# Patient Record
Sex: Female | Born: 1980 | Race: Black or African American | Hispanic: No | Marital: Single | Smoking: Never smoker
Health system: Southern US, Community
[De-identification: ages and names within clinical notes are randomized; demographics above are authoritative.]

## PROBLEM LIST (undated history)

## (undated) DIAGNOSIS — F329 Major depressive disorder, single episode, unspecified: Secondary | ICD-10-CM

## (undated) DIAGNOSIS — R519 Headache, unspecified: Secondary | ICD-10-CM

## (undated) DIAGNOSIS — F419 Anxiety disorder, unspecified: Secondary | ICD-10-CM

## (undated) DIAGNOSIS — R51 Headache: Secondary | ICD-10-CM

## (undated) DIAGNOSIS — G43909 Migraine, unspecified, not intractable, without status migrainosus: Secondary | ICD-10-CM

## (undated) DIAGNOSIS — E7800 Pure hypercholesterolemia, unspecified: Secondary | ICD-10-CM

## (undated) DIAGNOSIS — F32A Depression, unspecified: Secondary | ICD-10-CM

## (undated) HISTORY — DX: Migraine, unspecified, not intractable, without status migrainosus: G43.909

## (undated) HISTORY — DX: Depression, unspecified: F32.A

## (undated) HISTORY — DX: Major depressive disorder, single episode, unspecified: F32.9

---
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
## 2002-05-22 ENCOUNTER — Emergency Department (HOSPITAL_COMMUNITY): Admission: EM | Admit: 2002-05-22 | Discharge: 2002-05-22 | Payer: Self-pay | Admitting: Emergency Medicine

## 2002-05-24 ENCOUNTER — Emergency Department (HOSPITAL_COMMUNITY): Admission: EM | Admit: 2002-05-24 | Discharge: 2002-05-24 | Payer: Self-pay | Admitting: Emergency Medicine

## 2002-06-30 ENCOUNTER — Inpatient Hospital Stay (HOSPITAL_COMMUNITY): Admission: AD | Admit: 2002-06-30 | Discharge: 2002-06-30 | Payer: Self-pay | Admitting: Obstetrics and Gynecology

## 2004-01-07 ENCOUNTER — Emergency Department (HOSPITAL_COMMUNITY): Admission: EM | Admit: 2004-01-07 | Discharge: 2004-01-07 | Payer: Self-pay | Admitting: Family Medicine

## 2004-04-05 ENCOUNTER — Other Ambulatory Visit: Admission: RE | Admit: 2004-04-05 | Discharge: 2004-04-05 | Payer: Self-pay | Admitting: Obstetrics and Gynecology

## 2004-04-14 ENCOUNTER — Emergency Department (HOSPITAL_COMMUNITY): Admission: EM | Admit: 2004-04-14 | Discharge: 2004-04-15 | Payer: Self-pay | Admitting: Emergency Medicine

## 2006-03-20 ENCOUNTER — Ambulatory Visit: Payer: Self-pay | Admitting: Psychiatry

## 2006-03-20 ENCOUNTER — Emergency Department (HOSPITAL_COMMUNITY): Admission: EM | Admit: 2006-03-20 | Discharge: 2006-03-20 | Payer: Self-pay | Admitting: Emergency Medicine

## 2006-03-20 ENCOUNTER — Inpatient Hospital Stay (HOSPITAL_COMMUNITY): Admission: AD | Admit: 2006-03-20 | Discharge: 2006-03-27 | Payer: Self-pay | Admitting: Psychiatry

## 2007-05-21 ENCOUNTER — Other Ambulatory Visit: Admission: RE | Admit: 2007-05-21 | Discharge: 2007-05-21 | Payer: Self-pay | Admitting: Obstetrics and Gynecology

## 2007-09-02 ENCOUNTER — Emergency Department (HOSPITAL_COMMUNITY): Admission: EM | Admit: 2007-09-02 | Discharge: 2007-09-03 | Payer: Self-pay | Admitting: Emergency Medicine

## 2007-09-03 ENCOUNTER — Inpatient Hospital Stay (HOSPITAL_COMMUNITY): Admission: AD | Admit: 2007-09-03 | Discharge: 2007-09-09 | Payer: Self-pay | Admitting: Psychiatry

## 2007-09-03 ENCOUNTER — Ambulatory Visit: Payer: Self-pay | Admitting: Psychiatry

## 2008-01-26 ENCOUNTER — Emergency Department (HOSPITAL_COMMUNITY): Admission: EM | Admit: 2008-01-26 | Discharge: 2008-01-26 | Payer: Self-pay | Admitting: Emergency Medicine

## 2008-02-20 ENCOUNTER — Emergency Department (HOSPITAL_COMMUNITY): Admission: EM | Admit: 2008-02-20 | Discharge: 2008-02-20 | Payer: Self-pay | Admitting: Emergency Medicine

## 2008-02-23 ENCOUNTER — Inpatient Hospital Stay (HOSPITAL_COMMUNITY): Admission: EM | Admit: 2008-02-23 | Discharge: 2008-02-24 | Payer: Self-pay | Admitting: Emergency Medicine

## 2008-03-01 ENCOUNTER — Ambulatory Visit: Payer: Self-pay | Admitting: Family Medicine

## 2008-03-01 ENCOUNTER — Encounter: Payer: Self-pay | Admitting: *Deleted

## 2008-03-01 DIAGNOSIS — N12 Tubulo-interstitial nephritis, not specified as acute or chronic: Secondary | ICD-10-CM | POA: Insufficient documentation

## 2008-03-01 DIAGNOSIS — D649 Anemia, unspecified: Secondary | ICD-10-CM

## 2008-03-01 DIAGNOSIS — F3289 Other specified depressive episodes: Secondary | ICD-10-CM | POA: Insufficient documentation

## 2008-03-01 DIAGNOSIS — F329 Major depressive disorder, single episode, unspecified: Secondary | ICD-10-CM

## 2009-03-16 ENCOUNTER — Emergency Department (HOSPITAL_COMMUNITY)
Admission: EM | Admit: 2009-03-16 | Discharge: 2009-03-16 | Payer: Self-pay | Source: Home / Self Care | Admitting: Emergency Medicine

## 2009-11-30 ENCOUNTER — Ambulatory Visit: Payer: Self-pay | Admitting: Family Medicine

## 2009-12-28 ENCOUNTER — Observation Stay (HOSPITAL_COMMUNITY)
Admission: EM | Admit: 2009-12-28 | Discharge: 2009-12-28 | Payer: Self-pay | Source: Home / Self Care | Admitting: Emergency Medicine

## 2009-12-31 ENCOUNTER — Emergency Department (HOSPITAL_COMMUNITY)
Admission: EM | Admit: 2009-12-31 | Discharge: 2010-01-01 | Payer: Self-pay | Source: Home / Self Care | Admitting: Emergency Medicine

## 2010-02-13 NOTE — Assessment & Plan Note (Signed)
Summary: uri?,df   Vital Signs:  Patient profile:   30 year old female Weight:      155 pounds Temp:     98.5 degrees F oral Pulse rate:   87 / minute BP sitting:   117 / 73  (right arm) Cuff size:   regular  Vitals Entered By: Tessie Fass CMA (November 30, 2009 11:34 AM) CC: cough and congestion Is Patient Diabetic? No Pain Assessment Patient in pain? no        Primary Care Provider:  Helane Rima DO  CC:  cough and congestion.  History of Present Illness: 30 y/o F with cough and congestion x 2 wks   Sore throat x 2 wks Cough with green sputum x 1 1/2 wks.  Cough worse in AM No fever since last week, some chills last week +Rhinorrhea +Pain in ears intermitten +body aches x 2 days last week Sometimes feels faint, tired, drained Drinking lots of echinacea tea No vomiiting, no diarrhea Some watery eyes No wheezing No dypsnea Took Mucinex Took otc antihistamine Took multi-cold symptom otc med No weight loss, hemoptysis, night sweats  Current Medications (verified): 1)  Zoloft 50 Mg Tabs (Sertraline Hcl) .Marland Kitchen.. 1 1/2 Tabs By Mouth Daily Per Dr Dicky Doe At [facility]  Allergies (verified): No Known Drug Allergies  Review of Systems       per hpi   Physical Exam  General:  Well-developed,well-nourished,in no acute distress; alert,appropriate and cooperative throughout examination. vitals reviewed.  Ears:  External ear exam shows no significant lesions or deformities.  Otoscopic examination reveals clear canals, tympanic membranes are intact bilaterally without bulging, retraction, inflammation or discharge. Hearing is grossly normal bilaterally. Nose:  mucosal erythema.   Mouth:  Oral mucosa and oropharynx without lesions or exudates.  Teeth in good repair. Lungs:  Normal respiratory effort, chest expands symmetrically. Lungs are clear to auscultation, no crackles or wheezes. Heart:  Normal rate and regular rhythm. S1 and S2 normal without gallop, murmur,  click, rub or other extra sounds. Pulses:  +2 bilaterally  Extremities:  No clubbing, cyanosis, edema, or deformity noted with normal full range of motion of all joints.   Neurologic:  alert & oriented X3.     Impression & Recommendations:  Problem # 1:  COUGH (ICD-786.2) Assessment New Cough, sore throat, some myalgias x 2 wks.  Symptoms likely viral in beginning but since present for 2 wks, I am concerned for bacterial infection on top of this.  Pt is afebrile and lungs are cta.  Likely not pna.  Will treat with Doxycycline (pt is self paid and cannot afford Zithromax).  If not better in 1 wk, will rtc and will take CXR at that time.    Orders: [facility]- Est Level  3 (09811)  Complete Medication List: 1)  Zoloft 50 Mg Tabs (Sertraline hcl) .Marland Kitchen.. 1 1/2 tabs by mouth daily per dr bijelac at [facility] 2)  Doxycycline Hyclate 100 Mg Caps (Doxycycline hyclate) .Marland Kitchen.. 1 cap by mouth two times a day for 7 days. take with food.  Patient Instructions: 1)  Please schedule a follow-up appointment in 10 days if not better. 2)  You may have a bacterial infection on top of your viral infection.  Take doxycycline 100mg  by mouth two times a day.  Prescriptions: DOXYCYCLINE HYCLATE 100 MG CAPS (DOXYCYCLINE HYCLATE) 1 cap by mouth two times a day for 7 days. Take with food.  #14 x 0   Entered and Authorized by:  Cat Ta MD   Signed by:   Angeline Slim MD on 11/30/2009   Method used:   Electronically to        [facility] [address] (retail)       [phone]       [place], [place]  [address]       Ph: [phone]       Fax: [phone]   RxID:   [id number]    Orders Added: 1)  [facility]- Est Level  3 [24401]

## 2010-02-14 ENCOUNTER — Inpatient Hospital Stay (HOSPITAL_COMMUNITY)
Admission: RE | Admit: 2010-02-14 | Discharge: 2010-02-14 | Disposition: A | Discharge status: Home / Self Care | Payer: Self-pay | Source: Ambulatory Visit | Attending: Emergency Medicine | Admitting: Emergency Medicine

## 2010-02-22 ENCOUNTER — Encounter: Payer: Self-pay | Admitting: *Deleted

## 2010-03-26 LAB — DIFFERENTIAL
Basophils Absolute: 0 10*3/uL (ref 0.0–0.1)
Lymphocytes Relative: 15 % (ref 12–46)
Monocytes Absolute: 0.9 10*3/uL (ref 0.1–1.0)
Neutro Abs: 4.6 10*3/uL (ref 1.7–7.7)

## 2010-03-26 LAB — BASIC METABOLIC PANEL
BUN: 14 mg/dL (ref 6–23)
CO2: 25 mEq/L (ref 19–32)
CO2: 24 mEq/L (ref 19–32)
Calcium: 9.5 mg/dL (ref 8.4–10.5)
Chloride: 107 mEq/L (ref 96–112)
Creatinine, Ser: 0.85 mg/dL (ref 0.4–1.2)
GFR calc Af Amer: >60 mL/min (ref >60)
GFR calc non Af Amer: >60 mL/min (ref >60)
Glucose, Bld: 96 mg/dL (ref 70–99)

## 2010-03-26 LAB — CBC
HCT: 37.2 % (ref 36.0–46.0)
Hemoglobin: 12.1 g/dL (ref 12.0–15.0)
MCH: 29.8 pg (ref 26.0–34.0)
Platelets: 231 10*3/uL (ref 150–400)
RBC: 4.4 MIL/uL (ref 3.87–5.11)
RDW: 14.4 % (ref 11.5–15.5)
RDW: 14.1 % (ref 11.5–15.5)
WBC: 6.6 10*3/uL (ref 4.0–10.5)
WBC: 6.1 10*3/uL (ref 4.0–10.5)

## 2010-03-26 LAB — URINALYSIS, ROUTINE W REFLEX MICROSCOPIC
Nitrite: NEGATIVE
Protein, ur: NEGATIVE mg/dL
Specific Gravity, Urine: 1.017 (ref 1.005–1.030)
Urobilinogen, UA: 0.2 mg/dL (ref 0.0–1.0)
pH: 5.5 (ref 5.0–8.0)

## 2010-03-26 LAB — URINE MICROSCOPIC-ADD ON

## 2010-03-26 LAB — URINE CULTURE

## 2010-03-26 LAB — POCT I-STAT, CHEM 8
Chloride: 107 mEq/L (ref 96–112)
Glucose, Bld: 92 mg/dL (ref 70–99)
HCT: 40 % (ref 36.0–46.0)
Potassium: 4.3 mEq/L (ref 3.5–5.1)

## 2010-03-26 LAB — LACTIC ACID, PLASMA: Lactic Acid, Venous: 1.2 mmol/L (ref 0.5–2.2)

## 2010-03-26 LAB — T4: T4, Total: 7.8 ug/dL (ref 5.0–12.5)

## 2010-03-26 LAB — CK: Total CK: 126 U/L (ref 7–177)

## 2010-05-01 LAB — DIFFERENTIAL
Basophils Absolute: 0 10*3/uL (ref 0.0–0.1)
Eosinophils Relative: 0 % (ref 0–5)
Lymphocytes Relative: 17 % (ref 12–46)
Lymphocytes Relative: 19 % (ref 12–46)
Lymphs Abs: 1.6 10*3/uL (ref 0.7–4.0)
Monocytes Relative: 20 % — ABNORMAL HIGH (ref 3–12)
Monocytes Relative: 25 % — ABNORMAL HIGH (ref 3–12)
Neutro Abs: 4.6 10*3/uL (ref 1.7–7.7)
Neutrophils Relative %: 62 % (ref 43–77)

## 2010-05-01 LAB — BASIC METABOLIC PANEL
BUN: 10 mg/dL (ref 6–23)
CO2: 24 mEq/L (ref 19–32)
CO2: 25 mEq/L (ref 19–32)
Calcium: 7.8 mg/dL — ABNORMAL LOW (ref 8.4–10.5)
Chloride: 104 mEq/L (ref 96–112)
Creatinine, Ser: 0.82 mg/dL (ref 0.4–1.2)
Creatinine, Ser: 0.95 mg/dL (ref 0.4–1.2)
GFR calc non Af Amer: >60 mL/min (ref >60)
Glucose, Bld: 107 mg/dL — ABNORMAL HIGH (ref 70–99)
Glucose, Bld: 83 mg/dL (ref 70–99)
Sodium: 135 mEq/L (ref 135–145)

## 2010-05-01 LAB — URINALYSIS, ROUTINE W REFLEX MICROSCOPIC
Bilirubin Urine: NEGATIVE
Glucose, UA: NEGATIVE mg/dL
Glucose, UA: NEGATIVE mg/dL
Ketones, ur: 15 mg/dL — AB
Ketones, ur: 40 mg/dL — AB
Nitrite: POSITIVE — AB
Protein, ur: 100 mg/dL — AB
Protein, ur: 30 mg/dL — AB
Specific Gravity, Urine: 1.021 (ref 1.005–1.030)
Urobilinogen, UA: 2 mg/dL — ABNORMAL HIGH (ref 0.0–1.0)
pH: 6.5 (ref 5.0–8.0)

## 2010-05-01 LAB — IRON: Iron: 17 ug/dL — ABNORMAL LOW (ref 42–135)

## 2010-05-01 LAB — URINE CULTURE
Colony Count: NO GROWTH
Culture: NO GROWTH

## 2010-05-01 LAB — VITAMIN B12: Vitamin B-12: 476 pg/mL (ref 211–911)

## 2010-05-01 LAB — URINE MICROSCOPIC-ADD ON

## 2010-05-01 LAB — CBC
HCT: 29.6 % — ABNORMAL LOW (ref 36.0–46.0)
Hemoglobin: 10.6 g/dL — ABNORMAL LOW (ref 12.0–15.0)
Hemoglobin: 9.9 g/dL — ABNORMAL LOW (ref 12.0–15.0)
MCHC: 33.9 g/dL (ref 30.0–36.0)
Platelets: 174 10*3/uL (ref 150–400)
Platelets: 179 10*3/uL (ref 150–400)
RDW: 13.9 % (ref 11.5–15.5)
WBC: 8.3 10*3/uL (ref 4.0–10.5)

## 2010-05-01 LAB — PREGNANCY, URINE: Preg Test, Ur: NEGATIVE

## 2010-05-01 LAB — FOLATE RBC: RBC Folate: 594 ng/mL (ref 180–600)

## 2010-05-29 NOTE — H&P (Signed)
Victoria Brennan, Victoria Brennan                ACCOUNT NO.:  [id number]   MEDICAL RECORD NO.:  [id number]          PATIENT TYPE:  INP   LOCATION:                               FACILITY:  [facility]   PHYSICIAN:  Della Goo, M.D. DATE OF BIRTH:  30-May-1980   DATE OF ADMISSION:  02/23/2008  DATE OF DISCHARGE:                              HISTORY & PHYSICAL   CHIEF COMPLAINT:  Right-sided flank pain.   HISTORY OF PRESENT ILLNESS:  This is a 30 year old female who presents  to the emergency department with complaints of dysuria and right-sided  flank pain, worsening over the past 5 days.  She denies having any  fevers and chills prior to her visit.  She reports beginning to have  fever when she was in the emergency department.  She had also been seen  in the emergency department 1 day ago and given a prescription for  Macrobid therapy with Pyridium, but her symptoms continued to worsen.  She denies having any nausea or vomiting, diarrhea or constipation.  She  does report having a headache for the past 2 weeks which had been a  dull, diffuse headache.  She denies having any lightheadedness, syncope,  fatigue, shortness of breath, chest pain.   PAST MEDICAL HISTORY:  Significant for depression and insomnia.   MEDICATIONS AT THIS TIME:  None.   ALLERGIES:  The patient reports no known drug allergies.  However,  CYMBALTA is listed in the medical record.   SOCIAL HISTORY:  The patient is a nonsmoker, nondrinker, and she denies  any illicit drug usage.   FAMILY HISTORY:  Noncontributory.   PHYSICAL EXAMINATION FINDINGS:  This is a 30 year old thin, well-  nourished, well-developed female in discomfort but no acute distress.  VITAL SIGNS:  Temperature 100.9, blood pressure 135/78, heart rate 120,  respirations 18, O2 saturation 98% on room air.  HEENT EXAMINATION:  Normocephalic, atraumatic.  Pupils equally round,  reactive to light.  Extraocular movements are intact.  Funduscopic  benign.   There is no scleral icterus.  Nares are patent bilaterally.  Oropharynx is clear.  NECK:  Supple, full range of motion.  No thyromegaly, adenopathy or  jugular venous distention.  CARDIOVASCULAR:  Tachycardiac rate and rhythm.  No murmurs, gallops or  rubs.  LUNGS:  Clear to auscultation bilaterally.  ABDOMEN:  Positive bowel sounds, soft, nontender, nondistended.  EXTREMITIES:  Without cyanosis, clubbing or edema.  BACK EXAMINATION:  Positive right-sided costovertebral angle tenderness.  NEUROLOGIC EXAMINATION:  Alert and oriented x3.  There are no focal  deficits.   LABORATORY STUDIES:  White blood cell count 8.3, hemoglobin 9.9,  platelets 179 and hematocrit 29.6, neutrophils 56%, lymphocytes 19%.  Sodium 138, potassium 3.6, chloride 104, bicarb 25, BUN 10, creatinine  0.95, glucose 83.  Urinalysis positive for nitrates and large leukocyte  esterase.   ASSESSMENT:  A 30 year old female being admitted with:  1. Pyelonephritis.  2. Anemia.  3. Cephalgia.   PLAN:  The patient will be admitted and placed on IV antibiotic therapy  of ciprofloxacin.  IV fluids have been ordered for rehydration therapy.  An anemia workup will also be started and pain control therapy has been  ordered as needed.  The patient will be placed on DVT and GI prophylaxis  and further workup will ensue pending results of the patient's clinical  course.      Della Goo, M.D.  Electronically Signed     HJ/MEDQ  D:  02/24/2008  T:  02/24/2008  Job:  [id number]

## 2010-05-29 NOTE — Discharge Summary (Signed)
Victoria Brennan, TIEKEN                ACCOUNT NO.:  [id number]   MEDICAL RECORD NO.:  [id number]          PATIENT TYPE:  INP   LOCATION:  1512                         FACILITY:  [facility]   PHYSICIAN:  Richarda Overlie, MD       DATE OF BIRTH:  [birth year]   DATE OF ADMISSION:  02/23/2008  DATE OF DISCHARGE:  02/24/2008                               DISCHARGE SUMMARY   DISCHARGE DIAGNOSES:  1. Pyelonephritis.  2. Migraine headaches.  3. Iron-deficiency anemia.   SUBJECTIVE:  1. This is a 30 year old female who presented to the ER with a chief      complaint of dysuria and flank pain for the last 5 days.  The      patient denied any fever, chills or rigors at the time of her      admission in the ER.  The patient was seen previously and was      prescribed Macrobid and Pyridium, but her symptoms continued to      worsen.  At the time of presentation, the patient was found to have      a temperature of 100.9.  The patient had a CT scan of the abdomen      and pelvis done with contrast that showed changes likely secondary      to right pyelonephritis, also probable scarring, most likely due to      prior pyelonephritis within the upper pole of the right kidney.  An      urinary ultrasound showed evidence of cystitis.  The patient was      started on IV ciprofloxacin.  During her stay, the patient      complained of dysuria and a Chlamydia probe and a GC probe were      done, both of which were negative.  The patient was thought to have      Candidal vulvovaginitis and was given one dose of Diflucan and has      been continued on clotrimazole cream 1% at bedtime.  2. Anemia.  The patient was found to have iron-deficiency anemia.  She      denied any symptoms of heavy menstrual bleeding.  However, she did      complain of migraine headaches.  The patient was counseled about      minimizing use of NSAIDs and ibuprofen.  The patient will need an      outpatient workup for anemia.  She has being  started on ferrous      sulfate and a prenatal vitamin.  Repeat CBC is recommended in 1      month.  She will need her iron studies rechecked in a month's time      as well.  An H. pylori stool antigen or H. pylori antibody testing      also needs to be done.  If the patient stays anemic, then would      recommend consideration for an upper endoscopy to rule out peptic      ulcer disease.  3. Headaches.  The patient probably has migraine headaches.  She is  recommended to take only extra-strength Tylenol 1 tablet every 6      hours p.r.n.  4. Follow-up concerns.  The patient needs a primary care Ojas Coone and      needs to follow-up in 5-7 days.   DISCHARGE MEDICATIONS:  1. Ferrous sulfate 325 p.o. q.12.  2. Clotrimazole cream 1% at bedtime for 7 days.  3. Ciprofloxacin 500 mg p.o. q.12 x7 days.  4. K-Dur 40 mEq p.o. daily.  5. Prilosec OTC 20 mg p.o. q.12 h.  6. Pyridium 200 mg p.o. q.12 h., x3 days.  7. Prenatal vitamin 1 tablet p.o. daily.  8. Tylenol extra-strength 1 tablet p.o. q.6 h., p.r.n. headache.   RECOMMENDATIONS:  The patient can go back to school on February 25, 2008.  Repeat CBC with differential, iron studies, vitamin B12 and  folate in 1 month.  Recommended not to take any NSAIDs or ibuprofen.      Richarda Overlie, MD  Electronically Signed     NA/MEDQ  D:  02/24/2008  T:  02/24/2008  Job:  [id number]
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
## 2010-05-29 NOTE — H&P (Signed)
Victoria Brennan, Victoria Brennan NO.:  [id number]   MEDICAL RECORD NO.:  [id number]          PATIENT TYPE:  IPS   LOCATION:  0505                          FACILITY:  [facility]   PHYSICIAN:  Anselm Jungling, MD  DATE OF BIRTH:  11/28/1979   DATE OF ADMISSION:  09/03/2007  DATE OF DISCHARGE:                       PSYCHIATRIC ADMISSION ASSESSMENT   This is a voluntary admission to the services of Dr. Geralyn Flash.   IDENTIFYING INFORMATION:  This is a 30 year old single [place] American  female.  She presented to the [facility] ED last night.  She reported  that she was having increased depression with suicidal ideation.  She  stated that she had been thinking about killing herself.  She had a plan  to cut herself or OD.  She also reported that she was stressed, I  really don't want to talk about it.  She stated that she wanted to fall  asleep and never wake up, I can't deal with life anymore, I just don't  want to live.   PAST PSYCHIATRIC HISTORY:  Victoria Brennan was with [place] last year.  She presented  from March 6-13, 2008.  She states that she did not follow up on her  medication due to finances, that this past Monday she was prescribed  Prozac and lithium by Carolynn Serve, NP at [facility].   SOCIAL HISTORY:  She reports no changes from March, 2008.  She  apparently graduated high school in 2002.  She is employed as a Regulatory affairs officer.  She states that she works at [employer].  She  is not married and has no children.   FAMILY HISTORY:  She reports a strong family history for bipolar, her  grandmother, her mother, as well as aunts and cousins.   ALCOHOL/DRUG HISTORY:  Occasional social alcohol.   PRIMARY CARE Victoria Brennan:  She is followed at [facility] by  the nurse practitioner.   MEDICAL PROBLEMS:  None are known.   MEDICATIONS:  She continues to report being prescribed Prozac 20 mg p.o.  daily as well as lithium.  She states  that she has been prescribed 300  mg p.o. b.i.d. and 150 mg trazodone nightly.   DRUG ALLERGIES:  No known drug allergies.   POSITIVE PHYSICAL FINDINGS:  She had no alcohol.  Her UDS was negative.  She was medically cleared through the ED at [facility].   PHYSICAL EXAMINATION:  VITAL SIGNS ON ADMISSION:  She is 62 inches tall.  Weighs 159.  Temperature 97.5, blood pressure 94/62, pulse 75,  respirations 18.  MENTAL STATUS:  She was arousable, although she was quite drowsy.  She  appeared to have taken a shower.  She was appropriate dressed.  She had  groomed her hair.  Her speech was a little slow, but again, she was  groggy.  She had recently received Seroquel.  Her mood was flat and  depressed.  Her affect was congruent.  Her thought processes, she  stated, I have thought about killing myself.  She describes passive  methods such as just not  waking up.  There is no known past suicide  attempt.  There were no indications of psychosis or thought disorder.  Judgment and insight are fair.  Constitution and memory superficially  are intact.  Intelligence is at least average.   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   AXIS II:  Deferred.   AXIS III:  None known.   AXIS IV:  Moderate.   AXIS V:  25.   PLAN:  Admit for safety and stabilization.  Will check her lithium level  as requested.  We will adjust her meds as indicated.  We will be  sensitive to her financial situation, and we will also check her TSH.  She was continued on her above-listed meds, the Prozac 20, lithium carb  300 mg b.i.d., and trazodone 150 mg nightly.  At almost 7:00 tonight,  she was allowed to have Seroquel 25 mg p.o. x1 for her anxiety.   ESTIMATED LENGTH OF STAY:  4-5 days.      Mickie Leonarda Salon, P.A.-C.      Anselm Jungling, MD  Electronically Signed    MD/MEDQ  D:  09/03/2007  T:  09/03/2007  Job:  [id number]
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
## 2010-11-26 ENCOUNTER — Emergency Department (HOSPITAL_COMMUNITY): Payer: Self-pay

## 2010-11-26 ENCOUNTER — Emergency Department (HOSPITAL_COMMUNITY)
Admission: EM | Admit: 2010-11-26 | Discharge: 2010-11-26 | Disposition: A | Discharge status: Home / Self Care | Payer: Self-pay | Attending: Emergency Medicine | Admitting: Emergency Medicine

## 2010-11-26 DIAGNOSIS — R55 Syncope and collapse: Secondary | ICD-10-CM | POA: Insufficient documentation

## 2010-11-26 DIAGNOSIS — R51 Headache: Secondary | ICD-10-CM | POA: Insufficient documentation

## 2010-11-26 DIAGNOSIS — R42 Dizziness and giddiness: Secondary | ICD-10-CM | POA: Insufficient documentation

## 2010-11-26 HISTORY — DX: Headache: R51

## 2010-11-26 HISTORY — DX: Headache, unspecified: R51.9

## 2010-11-26 LAB — CBC
HCT: 38.3 % (ref 36.0–46.0)
Hemoglobin: 12.4 g/dL (ref 12.0–15.0)
MCHC: 32.4 g/dL (ref 30.0–36.0)
WBC: 5.8 10*3/uL (ref 4.0–10.5)

## 2010-11-26 LAB — URINE MICROSCOPIC-ADD ON

## 2010-11-26 LAB — URINALYSIS, ROUTINE W REFLEX MICROSCOPIC
Bilirubin Urine: NEGATIVE
Glucose, UA: NEGATIVE mg/dL
Ketones, ur: NEGATIVE mg/dL
Specific Gravity, Urine: 1.016 (ref 1.005–1.030)
pH: 7.5 (ref 5.0–8.0)

## 2010-11-26 LAB — DIFFERENTIAL
Eosinophils Relative: 1 % (ref 0–5)
Lymphocytes Relative: 18 % (ref 12–46)
Lymphs Abs: 1.1 10*3/uL (ref 0.7–4.0)
Monocytes Absolute: 0.7 10*3/uL (ref 0.1–1.0)

## 2010-11-26 LAB — BASIC METABOLIC PANEL
BUN: 13 mg/dL (ref 6–23)
Chloride: 105 mEq/L (ref 96–112)
GFR calc Af Amer: >90 mL/min (ref >90)
GFR calc non Af Amer: >90 mL/min (ref >90)
Potassium: 3.9 mEq/L (ref 3.5–5.1)
Sodium: 136 mEq/L (ref 135–145)

## 2010-11-26 MED ORDER — METOCLOPRAMIDE HCL 5 MG/ML IJ SOLN
10.0000 mg | Freq: Once | INTRAMUSCULAR | Status: AC
  Administered 2010-11-26: 10 mg via INTRAVENOUS
  Filled 2010-11-26: qty 2

## 2010-11-26 MED ORDER — SODIUM CHLORIDE 0.9 % IV SOLN
999.0000 mL | Freq: Once | INTRAVENOUS | Status: AC
  Administered 2010-11-26: 1000 mL via INTRAVENOUS

## 2010-11-26 MED ORDER — DIPHENHYDRAMINE HCL 50 MG/ML IJ SOLN
25.0000 mg | Freq: Once | INTRAMUSCULAR | Status: AC
  Administered 2010-11-26: 50 mg via INTRAVENOUS
  Filled 2010-11-26: qty 1

## 2010-11-26 MED ORDER — BUTALBITAL-APAP-CAFFEINE 50-325-40 MG PO TABS: 1.0000 | ORAL_TABLET | Freq: Four times a day (QID) | ORAL | Status: DC | PRN

## 2010-11-26 NOTE — ED Notes (Signed)
Pt to CT

## 2010-11-26 NOTE — ED Provider Notes (Signed)
History     CSN: [id number] Arrival date & time: 11/26/2010 12:00 PM   First MD Initiated Contact with Patient 11/26/10 1232      Chief Complaint  Patient presents with  . Near Syncope    dizzy and feeling like about to faint    (Consider location/radiation/quality/duration/timing/severity/associated sxs/prior treatment) HPI She notes years of headaches.  She now p/w HA, near-syncope.  She was in her [facility] prior to ~12hr ago.  She awoke w dizziness.  Since onset there has been some degree of dizziness, which the patient describes as either near-syncopal or room-spinning.  She also c/o nausea that began several hours later, and one episode of emesis.  No confusion, no disorientation,no visual changes, no neck pain, no f/c, no diarrhea. She has not taken anything for relief.  Notes that her headache is focally on the left side throbbing mostly posterior oral. Characteristically it is "the same" as innumerable headaches over the past few years.  Past Medical History  Diagnosis Date  . Persistent headaches     No past surgical history on file.  No family history on file.  History  Substance Use Topics  . Smoking status: Not on file  . Smokeless tobacco: Not on file  . Alcohol Use:     OB History    Grav Para Term Preterm Abortions TAB SAB Ect Mult Living                  Review of Systems  All other systems reviewed and are negative.    Allergies  Review of patient's allergies indicates no known allergies.  Home Medications   Current Outpatient Rx  Name Route Sig Dispense Refill  . BC HEADACHE POWDER PO Oral Take 1 tablet by mouth 2 (two) times daily as needed. For headache     . IBUPROFEN 200 MG PO TABS Oral Take 800 mg by mouth every 6 (six) hours as needed. For headache       BP 120/72  Pulse 84  Temp(Src) 98.5 F (36.9 C) (Oral)  Resp 20  SpO2 95%  Physical Exam  Constitutional: She is oriented to person, place, and time. She appears well-developed and  well-nourished.  HENT:  Head: Normocephalic and atraumatic.  Eyes: EOM are normal.  Cardiovascular: Normal rate and regular rhythm.   Pulmonary/Chest: Effort normal and breath sounds normal.  Abdominal: She exhibits no distension.  Musculoskeletal: She exhibits no edema and no tenderness.  Neurological: She is alert and oriented to person, place, and time. She displays no atrophy and no tremor. No cranial nerve deficit or sensory deficit. She exhibits normal muscle tone. She displays no seizure activity. Coordination and gait normal.  Skin: Skin is warm and dry.    ED Course  Procedures (including critical care time)   Labs Reviewed  BASIC METABOLIC PANEL  CBC  DIFFERENTIAL  URINALYSIS, ROUTINE W REFLEX MICROSCOPIC   No results found.   No diagnosis found.    MDM  This generally well young female presents with chronic headache and new episode of nausea, and dizziness. I exam the patient is in no distress, has no focal neurologic signs. Her vital signs are normal, and laboratory and radiographic studies are all within normal limits. Given the patient's history of headaches for years, her description today the headache was characteristically the same if these headaches as well as her resolution of symptoms with ED medications, migraine headaches are a consideration as a cause of this patient's discomfort. She will  be discharged with neurology followup and for analgesics. The importance of follow up care and continued evaluation of her headaches was discussed at length with both the patient and her family member.        Gerhard Munch, MD 11/26/10 1534

## 2010-11-26 NOTE — ED Notes (Signed)
Pt is going to go home with her mom for the night.  She is still a bit groggy from the benadryl, so her mom will be driving.

## 2010-11-26 NOTE — ED Notes (Signed)
Orthostatic VS done per c/o dizziness

## 2010-11-26 NOTE — ED Notes (Signed)
Pt alert and oriented x4. Respirations even and unlabored. Skin warm and dry. In no acute distress. Pt reports that she has headaches every day, but recently have gotten worse  Headaches have been everyday for the past month.  This morning around 10am she stood up and suddenly had projectile vomiting.  She felt like she was going to pass out.

## 2011-03-01 ENCOUNTER — Other Ambulatory Visit: Payer: Self-pay

## 2011-03-01 ENCOUNTER — Emergency Department (HOSPITAL_COMMUNITY)
Admission: EM | Admit: 2011-03-01 | Discharge: 2011-03-01 | Disposition: A | Discharge status: Home / Self Care | Payer: Self-pay | Attending: Emergency Medicine | Admitting: Emergency Medicine

## 2011-03-01 ENCOUNTER — Encounter (HOSPITAL_COMMUNITY): Payer: Self-pay | Admitting: *Deleted

## 2011-03-01 ENCOUNTER — Emergency Department (HOSPITAL_COMMUNITY): Payer: Self-pay

## 2011-03-01 DIAGNOSIS — M25519 Pain in unspecified shoulder: Secondary | ICD-10-CM | POA: Insufficient documentation

## 2011-03-01 DIAGNOSIS — R079 Chest pain, unspecified: Secondary | ICD-10-CM | POA: Insufficient documentation

## 2011-03-01 HISTORY — DX: Anxiety disorder, unspecified: F41.9

## 2011-03-01 MED ORDER — OXYCODONE-ACETAMINOPHEN 5-325 MG PO TABS: 1.0000 | ORAL_TABLET | ORAL | Status: AC | PRN

## 2011-03-01 MED ORDER — KETOROLAC TROMETHAMINE 60 MG/2ML IM SOLN
60.0000 mg | Freq: Once | INTRAMUSCULAR | Status: AC
  Administered 2011-03-01: 60 mg via INTRAMUSCULAR
  Filled 2011-03-01: qty 2

## 2011-03-01 NOTE — ED Notes (Signed)
The pt has had lt sided chest pain for 20 minutes. She was working upstairs  When it started no previous history

## 2011-03-01 NOTE — ED Provider Notes (Signed)
History     CSN: [id number]  Arrival date & time 03/01/11  8657   First MD Initiated Contact with Patient 03/01/11 0320      Chief Complaint  Patient presents with  . Chest Pain    Patient is a 31 y.o. female presenting with chest pain. The history is provided by the patient.  Chest Pain The chest pain began less than 1 hour ago. Chest pain occurs constantly. The chest pain is unchanged. Associated with: palpation/breathing/movement of left shoulder. The severity of the pain is moderate. The quality of the pain is described as aching. The pain does not radiate. Chest pain is worsened by certain positions and deep breathing. Pertinent negatives for primary symptoms include no syncope, no cough, no wheezing, no palpitations, no nausea, no vomiting and no dizziness.  Pertinent negatives for associated symptoms include no near-syncope and no weakness. She tried nothing for the symptoms. Risk factors include no known risk factors.   pt was at work (she works as Psychologist, sport and exercise) and noticed left shoulder/chest pain Worse with movement of upper body/breathing No recent travel/surgery No h/o CAD/PE/DVT She is not on OCPs Never had this pain before  Past Medical History  Diagnosis Date  . Persistent headaches   . Anxiety     History reviewed. No pertinent past surgical history.  History reviewed. No pertinent family history.  History  Substance Use Topics  . Smoking status: Never Smoker   . Smokeless tobacco: Not on file  . Alcohol Use: Yes    OB History    Grav Para Term Preterm Abortions TAB SAB Ect Mult Living                  Review of Systems  Respiratory: Negative for cough and wheezing.   Cardiovascular: Positive for chest pain. Negative for palpitations, syncope and near-syncope.  Gastrointestinal: Negative for nausea and vomiting.  Neurological: Negative for dizziness and weakness.  All other systems reviewed and are negative.    Allergies  Review of patient's  allergies indicates no known allergies.  Home Medications  No current outpatient prescriptions on file.  BP 118/71  Pulse 86  Resp 19  SpO2 97%  LMP 02/13/2011 BP 110/61  Pulse 84  Resp 14  SpO2 98%  LMP 02/13/2011   Physical Exam CONSTITUTIONAL: Well developed/well nourished HEAD AND FACE: Normocephalic/atraumatic EYES: EOMI/PERRL ENMT: Mucous membranes moist NECK: supple no meningeal signs SPINE:entire spine nontender CV: S1/S2 noted, no murmurs/rubs/gallops noted LUNGS: Lungs are clear to auscultation bilaterally, no apparent distress Chest - tender to palpation of left upper chest and with movement of left shoulder (reproduces exact pain) ABDOMEN: soft, nontender, no rebound or guarding GU:no cva tenderness NEURO: Pt is awake/alert, moves all extremitiesx4 EXTREMITIES: pulses normal, full ROM, no edema noted SKIN: warm, color normal PSYCH: no abnormalities of mood noted  ED Course  Procedures   Labs Reviewed - No data to display Dg Chest 2 View  03/01/2011  *RADIOLOGY REPORT*  Clinical Data: Shortness of breath.  Left-sided chest and shoulder pain.  CHEST - 2 VIEW  Comparison: 12/28/2009  Findings: Normal heart size and pulmonary vascularity.  No focal airspace consolidation in the lungs.  No blunting of costophrenic angles.  No pneumothorax.  Visualized bones appear intact.  No significant changes since the previous study.  IMPRESSION: No evidence of active pulmonary disease.  Original Report Authenticated By: Marlon Pel, M.D.    Pt with some improvement with toradol, now having pain mostly with  movement of upper body I doubt ACS/PE/Dissection at this time The patient appears reasonably screened and/or stabilized for discharge and I doubt any other medical condition or other [facility] requiring further screening, evaluation, or treatment in the ED at this time prior to discharge.    MDM  Nursing notes reviewed and considered in documentation xrays reviewed and  considered     Date: 03/01/2011  Rate: 105  Rhythm: sinus tachycardia  QRS Axis: normal  Intervals: normal  ST/T Wave abnormalities: normal  Conduction Disutrbances:none  Narrative Interpretation:   Old EKG Reviewed: unchanged          Joya Gaskins, MD 03/01/11 [phone]

## 2011-07-28 ENCOUNTER — Emergency Department (HOSPITAL_COMMUNITY)
Admission: EM | Admit: 2011-07-28 | Discharge: 2011-07-28 | Disposition: A | Discharge status: Home / Self Care | Payer: Self-pay | Attending: Emergency Medicine | Admitting: Emergency Medicine

## 2011-07-28 ENCOUNTER — Encounter (HOSPITAL_COMMUNITY): Payer: Self-pay | Admitting: *Deleted

## 2011-07-28 DIAGNOSIS — R42 Dizziness and giddiness: Secondary | ICD-10-CM | POA: Insufficient documentation

## 2011-07-28 DIAGNOSIS — R112 Nausea with vomiting, unspecified: Secondary | ICD-10-CM | POA: Insufficient documentation

## 2011-07-28 LAB — URINALYSIS, ROUTINE W REFLEX MICROSCOPIC
Bilirubin Urine: NEGATIVE
Nitrite: NEGATIVE
Protein, ur: NEGATIVE mg/dL
Specific Gravity, Urine: 1.027 (ref 1.005–1.030)
Urobilinogen, UA: 0.2 mg/dL (ref 0.0–1.0)

## 2011-07-28 LAB — POCT I-STAT, CHEM 8
BUN: 24 mg/dL — ABNORMAL HIGH (ref 6–23)
Calcium, Ion: 1.18 mmol/L (ref 1.12–1.23)
HCT: 39 % (ref 36.0–46.0)
Hemoglobin: 13.3 g/dL (ref 12.0–15.0)
Sodium: 138 mEq/L (ref 135–145)
TCO2: 24 mmol/L (ref 0–100)

## 2011-07-28 LAB — CBC
MCH: 29.4 pg (ref 26.0–34.0)
MCV: 86.6 fL (ref 78.0–100.0)
Platelets: 256 10*3/uL (ref 150–400)
RBC: 4.32 MIL/uL (ref 3.87–5.11)
RDW: 13 % (ref 11.5–15.5)

## 2011-07-28 LAB — POCT PREGNANCY, URINE: Preg Test, Ur: NEGATIVE

## 2011-07-28 MED ORDER — SODIUM CHLORIDE 0.9 % IV BOLUS (SEPSIS)
1000.0000 mL | Freq: Once | INTRAVENOUS | Status: AC
  Administered 2011-07-28: 1000 mL via INTRAVENOUS

## 2011-07-28 MED ORDER — ONDANSETRON HCL 4 MG PO TABS: 4.0000 mg | ORAL_TABLET | Freq: Four times a day (QID) | ORAL | Status: AC | PRN

## 2011-07-28 MED ORDER — ONDANSETRON HCL 4 MG/2ML IJ SOLN
4.0000 mg | INTRAMUSCULAR | Status: DC | PRN
  Administered 2011-07-28: 4 mg via INTRAVENOUS
  Filled 2011-07-28: qty 2

## 2011-07-28 MED ORDER — ONDANSETRON HCL 4 MG/2ML IJ SOLN
4.0000 mg | Freq: Once | INTRAMUSCULAR | Status: AC
  Administered 2011-07-28: 4 mg via INTRAVENOUS
  Filled 2011-07-28: qty 2

## 2011-07-28 MED ORDER — MECLIZINE HCL 25 MG PO TABS
25.0000 mg | ORAL_TABLET | Freq: Once | ORAL | Status: AC
  Administered 2011-07-28: 25 mg via ORAL
  Filled 2011-07-28: qty 1

## 2011-07-28 MED ORDER — ONDANSETRON HCL 4 MG PO TABS: 8.0000 mg | ORAL_TABLET | Freq: Once | ORAL | Status: DC

## 2011-07-28 NOTE — ED Notes (Signed)
Patient is alert and oriented x3.  She is reporting nausea, vomiting with dizziness that started this morning when she woke up. She stated that she was out drinking last night and says that she didn't drink as much as she normally does.  She had  Vomited last night before going to bed.  She denies any pain but does state she has a sick feeling

## 2011-07-28 NOTE — ED Notes (Signed)
Pt received ginger ale from RN.  Requested "real food."

## 2011-07-28 NOTE — ED Notes (Signed)
Pt ambulated to bathroom with steady gait. 

## 2011-07-28 NOTE — ED Provider Notes (Signed)
History     CSN: [id number]  Arrival date & time 07/28/11  4098   First MD Initiated Contact with Patient 07/28/11 0911      Chief Complaint  Patient presents with  . Nausea  . Vomiting  . Dizziness    (Consider location/radiation/quality/duration/timing/severity/associated sxs/prior treatment) HPI Comments: Patient with a history of anxiety and persistent headaches presents emergency department chief complaint of nausea vomiting and dizziness that began last night.  Patient states she was out drinking and when she got home she had an emesis episode.  When she awoke this morning she had 3 more episodes and felt very.  Patient states that she normally drinks more alcohol and does not have problems.  She denies any abdominal pain, fever, night sweats, chills, sick contacts, HA, CP , SOB, dysuria, vaginal dc, diarrhea, constipation, hematochezia, hematemesis, ataxia or syncope NO other complaints at this time.   The history is provided by the patient.    Past Medical History  Diagnosis Date  . Persistent headaches   . Anxiety     History reviewed. No pertinent past surgical history.  History reviewed. No pertinent family history.  History  Substance Use Topics  . Smoking status: Never Smoker   . Smokeless tobacco: Not on file  . Alcohol Use: Yes    OB History    Grav Para Term Preterm Abortions TAB SAB Ect Mult Living                  Review of Systems  Constitutional: Negative for fever and chills.  HENT: Negative for neck stiffness and dental problem.   Eyes: Negative for visual disturbance.  Respiratory: Negative for cough, chest tightness, shortness of breath and wheezing.   Cardiovascular: Negative for chest pain.  Gastrointestinal: Positive for nausea and vomiting. Negative for abdominal pain, diarrhea, constipation, blood in stool, abdominal distention, anal bleeding and rectal pain.  Genitourinary: Negative for dysuria, urgency, hematuria and flank pain.    Musculoskeletal: Negative for myalgias and arthralgias.  Skin: Negative for rash.  Neurological: Negative for dizziness, syncope, speech difficulty, numbness and headaches.  Hematological: Does not bruise/bleed easily.  All other systems reviewed and are negative.    Allergies  Review of patient's allergies indicates no known allergies.  Home Medications   Current Outpatient Rx  Name Route Sig Dispense Refill  . IBUPROFEN 200 MG PO TABS Oral Take 800 mg by mouth every 6 (six) hours as needed. For migraine      BP 108/67  Pulse 72  Temp 97.3 F (36.3 C) (Oral)  Resp 18  SpO2 100%  LMP 07/22/2011  Physical Exam  Constitutional: She is oriented to person, place, and time. She appears well-developed and well-nourished. No distress.  HENT:  Head: Normocephalic and atraumatic.  Mouth/Throat: No oropharyngeal exudate.       Moist mucous membranes.   Eyes: Conjunctivae and EOM are normal. Pupils are equal, round, and reactive to light. No scleral icterus.  Neck: Normal range of motion. Neck supple. No tracheal deviation present. No thyromegaly present.  Cardiovascular: Normal rate, regular rhythm, normal heart sounds and intact distal pulses.   Pulmonary/Chest: Effort normal and breath sounds normal. No stridor. No respiratory distress. She has no wheezes.  Abdominal: Soft.       Soft non tender to palpation. Normal bowel sounds  Musculoskeletal: Normal range of motion. She exhibits no edema and no tenderness.  Neurological: She is alert and oriented to person, place, and time. Coordination normal.  Skin: Skin is warm and dry. No rash noted. She is not diaphoretic. No erythema. No pallor.  Psychiatric: She has a normal mood and affect. Her behavior is normal.    ED Course  Procedures (including critical care time)  Labs Reviewed  URINALYSIS, ROUTINE W REFLEX MICROSCOPIC - Abnormal; Notable for the following:    APPearance CLOUDY (*)     All other components within normal  limits  POCT I-STAT, CHEM 8 - Abnormal; Notable for the following:    Potassium 5.5 (*)     BUN 24 (*)     Glucose, Bld 101 (*)     All other components within normal limits  CBC  POCT PREGNANCY, URINE   No results found.   No diagnosis found.    MDM  Nausea vomiting   Vitals are normal, no fever.  No signs of dehydration, tolerating PO fluids > 6 oz.  Lungs are clear.  No focal abdominal pain, no concern for appendicitis, cholecystitis, pancreatitis, ruptured viscus, UTI, kidney stone, or any other abdominal etiology.  Supportive therapy indicated with return if symptoms worsen.  Patient counseled.         Jaci Carrel, [place] 07/28/11 1306

## 2011-07-28 NOTE — ED Notes (Signed)
Pt received sandwich

## 2011-07-28 NOTE — ED Provider Notes (Signed)
Medical screening examination/treatment/procedure(s) were performed by non-physician practitioner and as supervising physician I was immediately available for consultation/collaboration.  Cheri Guppy, MD 07/28/11 1535

## 2011-07-28 NOTE — ED Notes (Signed)
Pt states she went out drinking last night and vomited one time.  Pt states she when she awoke this am she vomited for an hour and a half.  Pt c/o dizziness, but denies pain.  Pt states movement makes dizziness worse.  Pt is not actively vomiting on exam.  Pt's abdomen is soft and non-tender to palpation.

## 2011-07-28 NOTE — ED Notes (Signed)
Asked patient about giving a urine sample.  She states she is too dizzy to ambulate right now.  Zofran has taken the edge off her nausea, but still persists.  Pt has not vomited.

## 2011-12-02 ENCOUNTER — Encounter: Payer: Self-pay | Admitting: Obstetrics and Gynecology

## 2011-12-10 ENCOUNTER — Ambulatory Visit: Payer: Self-pay | Admitting: Sports Medicine

## 2012-01-27 ENCOUNTER — Encounter (HOSPITAL_BASED_OUTPATIENT_CLINIC_OR_DEPARTMENT_OTHER): Payer: Self-pay | Admitting: *Deleted

## 2012-01-27 ENCOUNTER — Emergency Department (HOSPITAL_BASED_OUTPATIENT_CLINIC_OR_DEPARTMENT_OTHER)
Admission: EM | Admit: 2012-01-27 | Discharge: 2012-01-27 | Disposition: A | Discharge status: Home / Self Care | Payer: Self-pay | Attending: Emergency Medicine | Admitting: Emergency Medicine

## 2012-01-27 ENCOUNTER — Emergency Department (HOSPITAL_COMMUNITY)
Admission: EM | Admit: 2012-01-27 | Discharge: 2012-01-27 | Disposition: A | Discharge status: Home / Self Care | Payer: Self-pay | Source: Home / Self Care

## 2012-01-27 DIAGNOSIS — G43909 Migraine, unspecified, not intractable, without status migrainosus: Secondary | ICD-10-CM | POA: Insufficient documentation

## 2012-01-27 DIAGNOSIS — R112 Nausea with vomiting, unspecified: Secondary | ICD-10-CM | POA: Insufficient documentation

## 2012-01-27 DIAGNOSIS — Z3202 Encounter for pregnancy test, result negative: Secondary | ICD-10-CM | POA: Insufficient documentation

## 2012-01-27 DIAGNOSIS — Z8659 Personal history of other mental and behavioral disorders: Secondary | ICD-10-CM | POA: Insufficient documentation

## 2012-01-27 DIAGNOSIS — R21 Rash and other nonspecific skin eruption: Secondary | ICD-10-CM | POA: Insufficient documentation

## 2012-01-27 LAB — BASIC METABOLIC PANEL
CO2: 24 mEq/L (ref 19–32)
Chloride: 102 mEq/L (ref 96–112)
Creatinine, Ser: 0.8 mg/dL (ref 0.50–1.10)
GFR calc Af Amer: >90 mL/min (ref >90)
Sodium: 139 mEq/L (ref 135–145)

## 2012-01-27 LAB — CBC WITH DIFFERENTIAL/PLATELET
Basophils Absolute: 0 10*3/uL (ref 0.0–0.1)
Basophils Relative: 0 % (ref 0–1)
HCT: 38.9 % (ref 36.0–46.0)
Lymphocytes Relative: 25 % (ref 12–46)
MCHC: 33.9 g/dL (ref 30.0–36.0)
Neutro Abs: 3.9 10*3/uL (ref 1.7–7.7)
Neutrophils Relative %: 58 % (ref 43–77)
Platelets: 253 10*3/uL (ref 150–400)
RDW: 13 % (ref 11.5–15.5)
WBC: 6.8 10*3/uL (ref 4.0–10.5)

## 2012-01-27 LAB — URINALYSIS, ROUTINE W REFLEX MICROSCOPIC
Glucose, UA: NEGATIVE mg/dL
Ketones, ur: NEGATIVE mg/dL
Leukocytes, UA: NEGATIVE
Nitrite: NEGATIVE
Protein, ur: NEGATIVE mg/dL
pH: 6.5 (ref 5.0–8.0)

## 2012-01-27 LAB — URINE MICROSCOPIC-ADD ON

## 2012-01-27 MED ORDER — SUMATRIPTAN SUCCINATE 50 MG PO TABS: 50.0000 mg | ORAL_TABLET | ORAL | Status: DC | PRN

## 2012-01-27 NOTE — ED Notes (Addendum)
Patient called, all areas checked; unable to locate patient; 3rd attempt call

## 2012-01-27 NOTE — ED Notes (Signed)
Pt called in all waiting areas; no answer x 2

## 2012-01-27 NOTE — ED Notes (Signed)
Pt provided with chicken noodle soup and gingerale

## 2012-01-27 NOTE — ED Notes (Signed)
Pt called in all waiting areas; no answer x 1 

## 2012-01-27 NOTE — ED Notes (Signed)
Pt c/o " migraine" x 1 day  

## 2012-01-27 NOTE — ED Provider Notes (Signed)
History   This chart was scribed for Osvaldo Human, MD by Donne Anon, ED Scribe. This patient was seen in room MH10/MH10 and the patient's care was started at 2015.   CSN: [id number]  Arrival date & time 01/27/12  Victoria Brennan   First MD Initiated Contact with Patient 01/27/12 2015      Chief Complaint  Patient presents with  . Migraine     Patient is a 32 y.o. female presenting with migraines. The history is provided by the patient. No language interpreter was used.  Migraine This is a chronic problem. The current episode started 12 to 24 hours ago. The problem occurs constantly. Associated symptoms include headaches. Pertinent negatives include no abdominal pain.   Victoria Brennan is a 32 y.o. female who presents to the Emergency Department complaining of gradual onset, moderate, constant HA that began today and has since subsided. She reports associated emesis, nausea, mild rash, and generalized weakness. She denies fever, cough, ear ache, sore throat, diarrhea, dysuria, fainting spells, abdominal pain, numbness or any other pain. She reports she has a history of chronic migraines which she manages by going to the ED. She had an appointment with PCP ( Dr. Dimas Aguas) for HA but did not go and states she plans to reschedule. Her last menstrual period was 01/23/12 although she says her migraines do not consistently occur while she is on her period. She reports she had bowel surgery for constipation as a child and that she is otherwise healthy.    Past Medical History  Diagnosis Date  . Persistent headaches   . Anxiety     History reviewed. No pertinent past surgical history.  History reviewed. No pertinent family history.  History  Substance Use Topics  . Smoking status: Never Smoker   . Smokeless tobacco: Not on file  . Alcohol Use: Yes     Review of Systems  Constitutional: Negative for fever.  HENT: Negative for ear pain and sore throat.   Eyes: Positive for photophobia.    Respiratory: Negative for cough.   Gastrointestinal: Positive for nausea and vomiting. Negative for abdominal pain and diarrhea.  Genitourinary: Negative for dysuria.  Skin: Positive for rash.  Neurological: Positive for headaches. Negative for seizures and numbness.  All other systems reviewed and are negative.    Allergies  Review of patient's allergies indicates no known allergies.  Home Medications   Current Outpatient Rx  Name  Route  Sig  Dispense  Refill  . IBUPROFEN 200 MG PO TABS   Oral   Take 800 mg by mouth every 6 (six) hours as needed. For migraine           Triage Vitals: BP 126/76  Pulse 64  Temp 98.1 F (36.7 C) (Oral)  Resp 16  Ht 5\' 2"  (1.575 m)  Wt 165 lb (74.844 kg)  BMI 30.18 kg/m2  SpO2 100%  LMP 01/23/2012  Physical Exam  Nursing note and vitals reviewed. Constitutional: She is oriented to person, place, and time. She appears well-developed and well-nourished. No distress.  HENT:  Head: Normocephalic and atraumatic.  Right Ear: External ear normal.  Left Ear: External ear normal.  Eyes: Conjunctivae normal are normal. Right eye exhibits no discharge. Left eye exhibits no discharge. No scleral icterus.  Neck: Normal range of motion. Neck supple.  Cardiovascular: Normal rate, regular rhythm and intact distal pulses.   Pulmonary/Chest: Effort normal and breath sounds normal. No respiratory distress. She has no wheezes. She has no rales.  Abdominal: Soft. Bowel sounds are normal. She exhibits no distension. There is no tenderness. There is no rebound and no guarding.  Musculoskeletal: Normal range of motion. She exhibits no edema and no tenderness.  Neurological: She is alert and oriented to person, place, and time. She has normal strength. No sensory deficit. Cranial nerve deficit:  no gross defecits noted. She exhibits normal muscle tone. She displays no seizure activity. Coordination normal.  Skin: Skin is warm and dry. No rash noted.   Psychiatric: She has a normal mood and affect. Her behavior is normal.    ED Course  Procedures (including critical care time) DIAGNOSTIC STUDIES: Oxygen Saturation is 100% on room air, normal by my interpretation.    COORDINATION OF CARE: 8:42 PM Discussed treatment plan which includes blood and urine labs with pt at bedside and pt agreed to plan.   10:18 PM Recheck. Discussed lab results with pt and encouraged her to drink more fluids daily due to her high potassium levels. She reports her HA has improved but she still feels weak. Discussed prescribing Imitrex for future migraines. Will order blood work.   Labs Reviewed  CBC WITH DIFFERENTIAL - Abnormal; Notable for the following:    Monocytes Relative 15 (*)     All other components within normal limits  URINALYSIS, ROUTINE W REFLEX MICROSCOPIC - Abnormal; Notable for the following:    Hgb urine dipstick MODERATE (*)     All other components within normal limits  URINE MICROSCOPIC-ADD ON - Abnormal; Notable for the following:    Squamous Epithelial / LPF FEW (*)     Bacteria, UA FEW (*)     All other components within normal limits  PREGNANCY, URINE  BASIC METABOLIC PANEL   Results for orders placed during the hospital encounter of 01/27/12  CBC WITH DIFFERENTIAL      Component Value Range   WBC 6.8  4.0 - 10.5 K/uL   RBC 4.43  3.87 - 5.11 MIL/uL   Hemoglobin 13.2  12.0 - 15.0 g/dL   HCT 16.1  09.6 - 04.5 %   MCV 87.8  78.0 - 100.0 fL   MCH 29.8  26.0 - 34.0 pg   MCHC 33.9  30.0 - 36.0 g/dL   RDW 40.9  81.1 - 91.4 %   Platelets 253  150 - 400 K/uL   Neutrophils Relative 58  43 - 77 %   Neutro Abs 3.9  1.7 - 7.7 K/uL   Lymphocytes Relative 25  12 - 46 %   Lymphs Abs 1.7  0.7 - 4.0 K/uL   Monocytes Relative 15 (*) 3 - 12 %   Monocytes Absolute 1.0  0.1 - 1.0 K/uL   Eosinophils Relative 2  0 - 5 %   Eosinophils Absolute 0.2  0.0 - 0.7 K/uL   Basophils Relative 0  0 - 1 %   Basophils Absolute 0.0  0.0 - 0.1 K/uL   BASIC METABOLIC PANEL      Component Value Range   Sodium 139  135 - 145 mEq/L   Potassium 3.8  3.5 - 5.1 mEq/L   Chloride 102  96 - 112 mEq/L   CO2 24  19 - 32 mEq/L   Glucose, Bld 91  70 - 99 mg/dL   BUN 9  6 - 23 mg/dL   Creatinine, Ser 7.82  0.50 - 1.10 mg/dL   Calcium 9.6  8.4 - 95.6 mg/dL   GFR calc non Af Amer >90  >90 mL/min  GFR calc Af Amer >90  >90 mL/min  URINALYSIS, ROUTINE W REFLEX MICROSCOPIC      Component Value Range   Color, Urine YELLOW  YELLOW   APPearance CLEAR  CLEAR   Specific Gravity, Urine 1.010  1.005 - 1.030   pH 6.5  5.0 - 8.0   Glucose, UA NEGATIVE  NEGATIVE mg/dL   Hgb urine dipstick MODERATE (*) NEGATIVE   Bilirubin Urine NEGATIVE  NEGATIVE   Ketones, ur NEGATIVE  NEGATIVE mg/dL   Protein, ur NEGATIVE  NEGATIVE mg/dL   Urobilinogen, UA 0.2  0.0 - 1.0 mg/dL   Nitrite NEGATIVE  NEGATIVE   Leukocytes, UA NEGATIVE  NEGATIVE  PREGNANCY, URINE      Component Value Range   Preg Test, Ur NEGATIVE  NEGATIVE  URINE MICROSCOPIC-ADD ON      Component Value Range   Squamous Epithelial / LPF FEW (*) RARE   WBC, UA 0-2  <3 WBC/hpf   RBC / HPF 0-2  <3 RBC/hpf   Bacteria, UA FEW (*) RARE    Lab workup is negative.  Rx Imitrex 50 mg at onset of migraine headache, may repeat in 2 hours.    1. Migraine headache      I personally performed the services described in this documentation, which was scribed in my presence. The recorded information has been reviewed and is accurate.  Osvaldo Human, MD         Carleene Cooper III, MD 01/27/12 [phone]

## 2012-01-27 NOTE — ED Notes (Signed)
Pt reports migrane that began this am,  Reports pressor behind eyes, pt took excedrin migraine 1-2 hours ago, also reports n/v throught out day,

## 2012-03-20 ENCOUNTER — Emergency Department (INDEPENDENT_AMBULATORY_CARE_PROVIDER_SITE_OTHER)
Admission: EM | Admit: 2012-03-20 | Discharge: 2012-03-20 | Disposition: A | Discharge status: Home / Self Care | Payer: Self-pay | Source: Home / Self Care

## 2012-03-20 ENCOUNTER — Encounter (HOSPITAL_COMMUNITY): Payer: Self-pay

## 2012-03-20 DIAGNOSIS — IMO0002 Reserved for concepts with insufficient information to code with codable children: Secondary | ICD-10-CM

## 2012-03-20 DIAGNOSIS — L03113 Cellulitis of right upper limb: Secondary | ICD-10-CM

## 2012-03-20 MED ORDER — CEPHALEXIN 500 MG PO CAPS: 500.0000 mg | ORAL_CAPSULE | Freq: Four times a day (QID) | ORAL | Status: DC

## 2012-03-20 MED ORDER — FLUTICASONE PROPIONATE 0.05 % EX CREA: TOPICAL_CREAM | Freq: Two times a day (BID) | CUTANEOUS | Status: DC

## 2012-03-20 NOTE — ED Provider Notes (Signed)
History     CSN: [id number]  Arrival date & time 03/20/12  1052   First MD Initiated Contact with Patient 03/20/12 1054      Chief Complaint  Patient presents with  . Cellulitis    (Consider location/radiation/quality/duration/timing/severity/associated sxs/prior treatment) Patient is a 32 y.o. female presenting with rash. The history is provided by the patient.  Rash Location:  Shoulder/arm Shoulder/arm rash location:  R forearm and R wrist Quality: painful, redness and swelling   Pain details:    Quality:  Hot and itching   Severity:  Moderate   Onset quality:  Sudden   Duration:  1 day   Progression:  Worsening Severity:  Moderate Chronicity:  New Context comment:  Thinks it may have come from insect/ spider bite   Past Medical History  Diagnosis Date  . Persistent headaches   . Anxiety     History reviewed. No pertinent past surgical history.  History reviewed. No pertinent family history.  History  Substance Use Topics  . Smoking status: Never Smoker   . Smokeless tobacco: Not on file  . Alcohol Use: Yes    OB History   Grav Para Term Preterm Abortions TAB SAB Ect Mult Living                  Review of Systems  Constitutional: Negative.   Skin: Positive for rash.    Allergies  Review of patient's allergies indicates no known allergies.  Home Medications   Current Outpatient Rx  Name  Route  Sig  Dispense  Refill  . cephALEXin (KEFLEX) 500 MG capsule   Oral   Take 1 capsule (500 mg total) by mouth 4 (four) times daily. Take all of medicine and drink lots of fluids   28 capsule   0   . fluticasone (CUTIVATE) 0.05 % cream   Topical   Apply topically 2 (two) times daily.   30 g   0   . ibuprofen (ADVIL,MOTRIN) 200 MG tablet   Oral   Take 800 mg by mouth every 6 (six) hours as needed. For migraine         . SUMAtriptan (IMITREX) 50 MG tablet   Oral   Take 1 tablet (50 mg total) by mouth every 2 (two) hours as needed for migraine  (Take one tablet at onset of headache, may be repeated in 2 hours.).   10 tablet   0     BP 122/81  Pulse 102  Temp(Src) 98.8 F (37.1 C) (Oral)  Resp 20  SpO2 98%  LMP 03/04/2012  Physical Exam  Nursing note and vitals reviewed. Constitutional: She is oriented to person, place, and time. She appears well-developed and well-nourished. No distress.  Neurological: She is alert and oriented to person, place, and time.  Skin: Skin is warm and dry. Rash noted.  Local 10x5 cm indurated erythematous patch to right ulnar wrist with central bite site likely. sl erythematous streaky lymphangitis to volar forearm.    ED Course  Procedures (including critical care time)  Labs Reviewed - No data to display No results found.   1. Cellulitis of arm, right       MDM          Linna Hoff, MD 03/20/12 1130

## 2012-03-20 NOTE — ED Notes (Signed)
States she noted ?bite? yest AM, and since then area has swollen, gotten red ; area measures ~10 x 10 w area extending from inner FA to lateral FA; painful to touch

## 2012-06-17 ENCOUNTER — Ambulatory Visit (INDEPENDENT_AMBULATORY_CARE_PROVIDER_SITE_OTHER): Payer: BC Managed Care – PPO | Admitting: Family Medicine

## 2012-06-17 VITALS — BP 110/70 | HR 80 | Temp 98.6°F | Resp 16 | Ht 63.0 in | Wt 178.6 lb

## 2012-06-17 DIAGNOSIS — Z0000 Encounter for general adult medical examination without abnormal findings: Secondary | ICD-10-CM

## 2012-06-17 DIAGNOSIS — F411 Generalized anxiety disorder: Secondary | ICD-10-CM

## 2012-06-17 DIAGNOSIS — N898 Other specified noninflammatory disorders of vagina: Secondary | ICD-10-CM

## 2012-06-17 DIAGNOSIS — Z113 Encounter for screening for infections with a predominantly sexual mode of transmission: Secondary | ICD-10-CM

## 2012-06-17 DIAGNOSIS — N760 Acute vaginitis: Secondary | ICD-10-CM

## 2012-06-17 DIAGNOSIS — B9689 Other specified bacterial agents as the cause of diseases classified elsewhere: Secondary | ICD-10-CM

## 2012-06-17 DIAGNOSIS — L293 Anogenital pruritus, unspecified: Secondary | ICD-10-CM

## 2012-06-17 DIAGNOSIS — E785 Hyperlipidemia, unspecified: Secondary | ICD-10-CM

## 2012-06-17 LAB — COMPREHENSIVE METABOLIC PANEL WITH GFR
AST: 17 U/L (ref 0–37)
Albumin: 4.3 g/dL (ref 3.5–5.2)
Alkaline Phosphatase: 55 U/L (ref 39–117)
BUN: 9 mg/dL (ref 6–23)
Creat: 0.95 mg/dL (ref 0.50–1.10)
Glucose, Bld: 87 mg/dL (ref 70–99)
Potassium: 4.1 meq/L (ref 3.5–5.3)
Total Bilirubin: 0.6 mg/dL (ref 0.3–1.2)

## 2012-06-17 LAB — LIPID PANEL
Cholesterol: 244 mg/dL — ABNORMAL HIGH (ref 0–200)
HDL: 68 mg/dL (ref >39)
LDL Cholesterol: 164 mg/dL — ABNORMAL HIGH (ref 0–99)
Total CHOL/HDL Ratio: 3.6 ratio
Triglycerides: 59 mg/dL (ref <150)
VLDL: 12 mg/dL (ref 0–40)

## 2012-06-17 LAB — COMPREHENSIVE METABOLIC PANEL
ALT: <8 U/L (ref 0–35)
CO2: 25 mEq/L (ref 19–32)
Calcium: 8.9 mg/dL (ref 8.4–10.5)
Chloride: 104 mEq/L (ref 96–112)
Sodium: 139 mEq/L (ref 135–145)
Total Protein: 7 g/dL (ref 6.0–8.3)

## 2012-06-17 LAB — POCT CBC
Granulocyte percent: 67.8 % (ref 37–80)
HCT, POC: 42.7 % (ref 37.7–47.9)
Hemoglobin: 13.2 g/dL (ref 12.2–16.2)
Lymph, poc: 1.3 (ref 0.6–3.4)
MCH, POC: 28.8 pg (ref 27–31.2)
MCHC: 30.9 g/dL — AB (ref 31.8–35.4)
MCV: 93 fL (ref 80–97)
MID (cbc): 0.5 (ref 0–0.9)
MPV: 9.3 fL (ref 0–99.8)
POC Granulocyte: 3.7 (ref 2–6.9)
POC LYMPH PERCENT: 23.4 % (ref 10–50)
POC MID %: 8.8 %M (ref 0–12)
Platelet Count, POC: 248 10*3/uL (ref 142–424)
RBC: 4.59 M/uL (ref 4.04–5.48)
RDW, POC: 13.4 %
WBC: 5.4 10*3/uL (ref 4.6–10.2)

## 2012-06-17 LAB — POCT WET PREP WITH KOH
KOH Prep POC: NEGATIVE
RBC Wet Prep HPF POC: NEGATIVE
Trichomonas, UA: NEGATIVE
Yeast Wet Prep HPF POC: NEGATIVE

## 2012-06-17 LAB — TSH: TSH: 0.729 u[IU]/mL (ref 0.350–4.500)

## 2012-06-17 MED ORDER — METRONIDAZOLE 500 MG PO TABS: 500.0000 mg | ORAL_TABLET | Freq: Two times a day (BID) | ORAL | Status: DC

## 2012-06-17 NOTE — Progress Notes (Signed)
[facility]:  Office Visit  Chief Complaint:  Chief Complaint  Patient presents with  . Annual Exam    HPI: Victoria Brennan is a 32 y.o. female who is here for an annual exam. 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  Last annual ? Last pap? No history abnormal pap Does do SBE LMP May 18 Menarche 14, q30 days, ;ast 6-7 days, heavy then normal cycles G0 Denies STDs Paternal grandmother died from uterine cancer Sexually active-does not use condoms H/o chronic migraines, 1-2 /week. 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  Sometimes has light sensitivity and nausea.   Past Medical History  Diagnosis Date  . Persistent headaches   . Anxiety    No past surgical history on file. History   Social History  . Marital Status: Single    Spouse Name: N/A    Number of Children: N/A  . Years of Education: N/A   Social History Main Topics  . Smoking status: Never Smoker   . Smokeless tobacco: Not on file  . Alcohol Use: Yes  . Drug Use: No  . Sexually Active: Yes    Birth Control/ Protection: None   Other Topics Concern  . Not on file   Social History Narrative  . No narrative on file   Family History  Problem Relation Age of Onset  . Hypertension Mother   . Heart disease Mother   . Hypertension Maternal Grandmother   . Cancer Paternal Grandmother   . Heart disease Paternal Grandfather    No Known Allergies Prior to Admission medications   Medication Sig Start Date End Date Taking? Authorizing Provider  ibuprofen (ADVIL,MOTRIN) 200 MG tablet Take 800 mg by mouth every 6 (six) hours as needed. For migraine   Yes Historical Provider, MD  SUMAtriptan (IMITREX) 50 MG tablet Take 1 tablet (50 mg total) by mouth every 2 (two) hours as needed for migraine (Take one tablet at onset of headache, may be repeated in 2 hours.). 01/27/12  Yes Carleene Cooper III, MD  cephALEXin (KEFLEX) 500 MG capsule Take 1 capsule (500 mg total) by  mouth 4 (four) times daily. Take all of medicine and drink lots of fluids 03/20/12   Linna Hoff, MD  fluticasone (CUTIVATE) 0.05 % cream Apply topically 2 (two) times daily. 03/20/12   Linna Hoff, MD     ROS: The patient denies fevers, chills, night sweats, unintentional weight loss, chest pain, palpitations, wheezing, dyspnea on exertion, nausea, vomiting, abdominal pain, dysuria, hematuria, melena, numbness, weakness, or tingling.   All other systems have been reviewed and were otherwise negative with the exception of those mentioned in the HPI and as above.    PHYSICAL EXAM: Filed Vitals:   06/17/12 1525  BP: 110/70  Pulse: 80  Temp: 98.6 F (37 C)  Resp: 16   Filed Vitals:   06/17/12 1525  Height: 5\' 3"  (1.6 m)  Weight: 178 lb 9.6 oz (81.012 kg)   Body mass index is 31.65 kg/(m^2).  General: Alert, no acute distress HEENT:  Normocephalic, atraumatic, oropharynx patent. EOMI, PERRLA, fundoscopic exam nl Cardiovascular:  Regular rate and rhythm, no rubs murmurs or gallops.  No Carotid bruits, radial pulse intact. No pedal edema.  Respiratory: Clear to auscultation bilaterally.  No wheezes, rales, or rhonchi.  No cyanosis, no use of accessory musculature GI: No organomegaly, abdomen is soft and non-tender, positive bowel sounds.  No masses. Skin: No  rashes. Neurologic: Facial musculature symmetric. Psychiatric: Patient is appropriate throughout our interaction. Lymphatic: No cervical lymphadenopathy Musculoskeletal: Gait intact. GU-cervix looks nl, nulliparous, she has a slightly posterior and tilted cervix , no masses or lesions. Slight excoriated area from scratching on labia due to itching. NO HSV ulcers. . She has had some dc but no odor. Breast exam-nl  LABS: Results for orders placed in visit on 06/17/12  POCT CBC      Result Value Range   WBC 5.4  4.6 - 10.2 K/uL   Lymph, poc 1.3  0.6 - 3.4   POC LYMPH PERCENT 23.4  10 - 50 %L   MID (cbc) 0.5  0 - 0.9   POC MID  % 8.8  0 - 12 %M   POC Granulocyte 3.7  2 - 6.9   Granulocyte percent 67.8  37 - 80 %G   RBC 4.59  4.04 - 5.48 M/uL   Hemoglobin 13.2  12.2 - 16.2 g/dL   HCT, POC 16.1  09.6 - 47.9 %   MCV 93.0  80 - 97 fL   MCH, POC 28.8  27 - 31.2 pg   MCHC 30.9 (*) 31.8 - 35.4 g/dL   RDW, POC 04.5     Platelet Count, POC 248  142 - 424 K/uL   MPV 9.3  0 - 99.8 fL  POCT WET PREP WITH KOH      Result Value Range   Trichomonas, UA Negative     Clue Cells Wet Prep HPF POC 0-5     Epithelial Wet Prep HPF POC 10-20     Yeast Wet Prep HPF POC neg     Bacteria Wet Prep HPF POC 3+     RBC Wet Prep HPF POC neg     WBC Wet Prep HPF POC 2-6     KOH Prep POC Negative       EKG/XRAY:   Primary read interpreted by Dr. Conley Rolls at [facility].   ASSESSMENT/PLAN: Encounter Diagnoses  Name Primary?  . Annual physical exam Yes  . Screening for STD (sexually transmitted disease)   . Hyperlipidemia   . Vaginal itching   . Anxiety state, unspecified    Pleasant 32 y/o female who came in for annual visit with multiple concerns Annual labs pending Rx Flagyl for BV Anxiety was an "oh by the way" comment at then end of our visit. She denies any SI/HI/ or prior psych hospitalization.  Will refer to therapist for CBT and anxiety counseling. Return to office and see how she does after CBT therapy. She is starting school in August so I would like her to return in July for this reassessment. She has had anxiety for as long as she can remember but she is nervous now that she is starting school. She has managed without meds for sometime . Was previously on meds but it made her feel like a zombie.  F/u in 1 month either with myself or with Dr. Clelia Croft if desires appt.   Hamilton Capri PHUONG, DO 06/17/2012 4:52 PM

## 2012-06-17 NOTE — Patient Instructions (Signed)
Bacterial Vaginosis Bacterial vaginosis (BV) is a vaginal infection where the normal balance of bacteria in the vagina is disrupted. The normal balance is then replaced by an overgrowth of certain bacteria. There are several different kinds of bacteria that can cause BV. BV is the most common vaginal infection in women of childbearing age. CAUSES   The cause of BV is not fully understood. BV develops when there is an increase or imbalance of harmful bacteria.  Some activities or behaviors can upset the normal balance of bacteria in the vagina and put women at increased risk including:  Having a new sex partner or multiple sex partners.  Douching.  Using an intrauterine device (IUD) for contraception.  It is not clear what role sexual activity plays in the development of BV. However, women that have never had sexual intercourse are rarely infected with BV. Women do not get BV from toilet seats, bedding, swimming pools or from touching objects around them.  SYMPTOMS   Grey vaginal discharge.  A fish-like odor with discharge, especially after sexual intercourse.  Itching or burning of the vagina and vulva.  Burning or pain with urination.  Some women have no signs or symptoms at all. DIAGNOSIS  Your caregiver must examine the vagina for signs of BV. Your caregiver will perform lab tests and look at the sample of vaginal fluid through a microscope. They will look for bacteria and abnormal cells (clue cells), a pH test higher than 4.5, and a positive amine test all associated with BV.  RISKS AND COMPLICATIONS   Pelvic inflammatory disease (PID).  Infections following gynecology surgery.  Developing HIV.  Developing herpes virus. TREATMENT  Sometimes BV will clear up without treatment. However, all women with symptoms of BV should be treated to avoid complications, especially if gynecology surgery is planned. Female partners generally do not need to be treated. However, BV may spread  between female sex partners so treatment is helpful in preventing a recurrence of BV.   BV may be treated with antibiotics. The antibiotics come in either pill or vaginal cream forms. Either can be used with nonpregnant or pregnant women, but the recommended dosages differ. These antibiotics are not harmful to the baby.  BV can recur after treatment. If this happens, a second round of antibiotics will often be prescribed.  Treatment is important for pregnant women. If not treated, BV can cause a premature delivery, especially for a pregnant woman who had a premature birth in the past. All pregnant women who have symptoms of BV should be checked and treated.  For chronic reoccurrence of BV, treatment with a type of prescribed gel vaginally twice a week is helpful. HOME CARE INSTRUCTIONS   Finish all medication as directed by your caregiver.  Do not have sex until treatment is completed.  Tell your sexual partner that you have a vaginal infection. They should see their caregiver and be treated if they have problems, such as a mild rash or itching.  Practice safe sex. Use condoms. Only have 1 sex partner. PREVENTION  Basic prevention steps can help reduce the risk of upsetting the natural balance of bacteria in the vagina and developing BV:  Do not have sexual intercourse (be abstinent).  Do not douche.  Use all of the medicine prescribed for treatment of BV, even if the signs and symptoms go away.  Tell your sex partner if you have BV. That way, they can be treated, if needed, to prevent reoccurrence. SEEK MEDICAL CARE IF:     Your symptoms are not improving after 3 days of treatment.  You have increased discharge, pain, or fever. MAKE SURE YOU:   Understand these instructions.  Will watch your condition.  Will get help right away if you are not doing well or get worse. FOR MORE INFORMATION  Division of STD Prevention (DSTDP), Centers for Disease Control and Prevention:  www.cdc.gov/std American Social Health Association (ASHA): www.ashastd.org  Document Released: 12/31/2004 Document Revised: 03/25/2011 Document Reviewed: 06/23/2008 Exit[employer] Patient Information 2014 [employer].  

## 2012-06-18 LAB — HIV ANTIBODY (ROUTINE TESTING W REFLEX): HIV: NONREACTIVE

## 2012-06-18 LAB — HEPATITIS B SURFACE ANTIGEN: Hepatitis B Surface Ag: NEGATIVE

## 2012-06-18 LAB — HEPATITIS B SURFACE ANTIBODY, QUANTITATIVE: Hep B S AB Quant (Post): >1000 m[IU]/mL

## 2012-06-18 LAB — HEPATITIS C ANTIBODY: HCV Ab: NEGATIVE

## 2012-06-18 LAB — RPR

## 2012-06-19 LAB — PAP IG, CT-NG, RFX HPV ASCU
Chlamydia Probe Amp: NEGATIVE
GC Probe Amp: NEGATIVE

## 2012-06-29 ENCOUNTER — Telehealth: Payer: Self-pay

## 2012-06-29 NOTE — Telephone Encounter (Signed)
Pt is needing to talk with dr Conley Rolls about some names of providers she is recommending her to see   Best number [phone]

## 2012-07-01 NOTE — Telephone Encounter (Signed)
Dr Conley Rolls I read something about a psychiatry referral in your notes, is this what she was referring to?

## 2012-07-01 NOTE — Telephone Encounter (Signed)
Patient lost the referral numbers I had given her. Gave her 3 diffreent ones, she will try calling them. She wants something more natural and no meds for now. No SI/HI. She will come back if she is not able to get the help she needs for meds etc.

## 2012-07-14 ENCOUNTER — Ambulatory Visit: Payer: BC Managed Care – PPO

## 2012-07-14 ENCOUNTER — Ambulatory Visit (INDEPENDENT_AMBULATORY_CARE_PROVIDER_SITE_OTHER): Payer: BC Managed Care – PPO | Admitting: Family Medicine

## 2012-07-14 DIAGNOSIS — N912 Amenorrhea, unspecified: Secondary | ICD-10-CM

## 2012-07-14 DIAGNOSIS — M79609 Pain in unspecified limb: Secondary | ICD-10-CM

## 2012-07-14 DIAGNOSIS — M25559 Pain in unspecified hip: Secondary | ICD-10-CM

## 2012-07-14 LAB — POCT URINE PREGNANCY: Preg Test, Ur: NEGATIVE

## 2012-07-14 MED ORDER — MELOXICAM 7.5 MG PO TABS: 7.5000 mg | ORAL_TABLET | Freq: Every day | ORAL | Status: DC

## 2012-07-14 NOTE — Progress Notes (Signed)
  Subjective:    Patient ID: Victoria Brennan, female    DOB: 12-15-1980, 32 y.o.   MRN: [id number]  HPI Patient presents, tearful in clinic today. She states she has anterior thigh pain bilaterally this began yesterday. She has no known injury. She is tearful because she thinks she has bone cancer. Patient states the right thigh is more painful than the left. Pain seems to come and go   Review of Systems No recent fall, no trauma, no calf tenderness, no use of birth control, no history of phlebitis    Objective:   Physical Exam Well developed slightly overweight female in no acute distress Normal range of motion hips and knees Abdomen soft and non tender Good pedal pulses Non tender over lumbar spine Good strength of lower extremities Normal reflexes bilateral lower extremities [facility] reading (PRIMARY) by  Dr. Milus Glazier: Normal femur (right) Results for orders placed in visit on 07/14/12  POCT URINE PREGNANCY      Result Value Range   Preg Test, Ur Negative     .       Assessment & Plan:  Myalgia the patient describes as not typical for a serious problem. I think he must have some form of inflammation which is probably viral in origin.  Plan: Meloxicam and followup if pain persists for 48 hours.  Signed, Elvina Sidle of the

## 2012-07-14 NOTE — Patient Instructions (Addendum)
The x-rays are normal. There is no evidence whatsoever of cancer. We believe that you have a viral illness that is affecting the muscles in the legs and should resolve in the next 48 hours. If pain persists, worsens, return for reevaluation

## 2012-09-13 ENCOUNTER — Encounter (HOSPITAL_COMMUNITY): Payer: Self-pay | Admitting: *Deleted

## 2012-09-13 ENCOUNTER — Emergency Department (HOSPITAL_COMMUNITY)
Admission: EM | Admit: 2012-09-13 | Discharge: 2012-09-14 | Disposition: A | Discharge status: Home / Self Care | Payer: BC Managed Care – PPO | Attending: Emergency Medicine | Admitting: Emergency Medicine

## 2012-09-13 DIAGNOSIS — B349 Viral infection, unspecified: Secondary | ICD-10-CM

## 2012-09-13 DIAGNOSIS — Z8659 Personal history of other mental and behavioral disorders: Secondary | ICD-10-CM | POA: Insufficient documentation

## 2012-09-13 DIAGNOSIS — G43909 Migraine, unspecified, not intractable, without status migrainosus: Secondary | ICD-10-CM | POA: Insufficient documentation

## 2012-09-13 DIAGNOSIS — B338 Other specified viral diseases: Secondary | ICD-10-CM | POA: Insufficient documentation

## 2012-09-13 DIAGNOSIS — Z3202 Encounter for pregnancy test, result negative: Secondary | ICD-10-CM | POA: Insufficient documentation

## 2012-09-13 DIAGNOSIS — R110 Nausea: Secondary | ICD-10-CM | POA: Insufficient documentation

## 2012-09-13 DIAGNOSIS — R509 Fever, unspecified: Secondary | ICD-10-CM | POA: Insufficient documentation

## 2012-09-13 DIAGNOSIS — R52 Pain, unspecified: Secondary | ICD-10-CM | POA: Insufficient documentation

## 2012-09-13 LAB — CBC WITH DIFFERENTIAL/PLATELET
Eosinophils Absolute: 0 10*3/uL (ref 0.0–0.7)
Hemoglobin: 12.1 g/dL (ref 12.0–15.0)
Lymphocytes Relative: 11 % — ABNORMAL LOW (ref 12–46)
Lymphs Abs: 1 10*3/uL (ref 0.7–4.0)
MCH: 29.2 pg (ref 26.0–34.0)
MCV: 87.7 fL (ref 78.0–100.0)
Monocytes Relative: 13 % — ABNORMAL HIGH (ref 3–12)
Neutrophils Relative %: 75 % (ref 43–77)
RBC: 4.14 MIL/uL (ref 3.87–5.11)

## 2012-09-13 LAB — BASIC METABOLIC PANEL
BUN: 10 mg/dL (ref 6–23)
CO2: 21 mEq/L (ref 19–32)
Chloride: 107 mEq/L (ref 96–112)
GFR calc non Af Amer: 76 mL/min — ABNORMAL LOW (ref >90)
Glucose, Bld: 100 mg/dL — ABNORMAL HIGH (ref 70–99)
Potassium: 4 mEq/L (ref 3.5–5.1)

## 2012-09-13 LAB — PREGNANCY, URINE: Preg Test, Ur: NEGATIVE

## 2012-09-13 MED ORDER — ACETAMINOPHEN 325 MG PO TABS
650.0000 mg | ORAL_TABLET | Freq: Once | ORAL | Status: AC
Start: 2012-09-13 — End: 2012-09-13
  Administered 2012-09-13: 650 mg via ORAL

## 2012-09-13 MED ORDER — ACETAMINOPHEN 325 MG PO TABS
ORAL_TABLET | ORAL | Status: AC
  Administered 2012-09-13: 650 mg via ORAL
  Filled 2012-09-13: qty 2

## 2012-09-13 MED ORDER — SODIUM CHLORIDE 0.9 % IV SOLN: INTRAVENOUS | Status: DC

## 2012-09-13 MED ORDER — MORPHINE SULFATE 4 MG/ML IJ SOLN
4.0000 mg | Freq: Once | INTRAMUSCULAR | Status: AC
  Administered 2012-09-13: 4 mg via INTRAVENOUS
  Filled 2012-09-13: qty 1

## 2012-09-13 MED ORDER — SODIUM CHLORIDE 0.9 % IV BOLUS (SEPSIS)
1000.0000 mL | Freq: Once | INTRAVENOUS | Status: AC
  Administered 2012-09-13: 1000 mL via INTRAVENOUS

## 2012-09-13 MED ORDER — ONDANSETRON HCL 4 MG/2ML IJ SOLN
4.0000 mg | Freq: Once | INTRAMUSCULAR | Status: AC
  Administered 2012-09-13: 4 mg via INTRAVENOUS
  Filled 2012-09-13: qty 2

## 2012-09-13 NOTE — ED Provider Notes (Signed)
CSN: [id number]     Arrival date & time 09/13/12  2125 History   First MD Initiated Contact with Patient 09/13/12 2132     Chief Complaint  Patient presents with  . Near Syncope  . Generalized Body Aches   (Consider location/radiation/quality/duration/timing/severity/associated sxs/prior Treatment) HPI Comments: Victoria Brennan is a 32 y.o. Female who states that she has generalized achiness and low-grade fever with nausea for 3 days since getting an influenza vaccination. She denies cough, documented fever, abdominal pain, back pain, neck pain, or chest pain. She has a mild headache. She has lightheadedness with standing and walking. There's been no syncope. She cannot recall when her last menstrual period, was. No other known modifying factors.  The history is provided by the patient.    Past Medical History  Diagnosis Date  . Persistent headaches   . Anxiety   . Depression   . Migraine    History reviewed. No pertinent past surgical history. Family History  Problem Relation Age of Onset  . Hypertension Mother   . Heart disease Mother   . Hypertension Maternal Grandmother   . Diabetes Maternal Grandmother   . Cancer Paternal Grandmother   . Heart disease Paternal Grandfather   . Hypertension Father   . Depression Brother    History  Substance Use Topics  . Smoking status: Never Smoker   . Smokeless tobacco: Not on file  . Alcohol Use: Yes   OB History   Grav Para Term Preterm Abortions TAB SAB Ect Mult Living                 Review of Systems  All other systems reviewed and are negative.    Allergies  Review of patient's allergies indicates no known allergies.  Home Medications   Current Outpatient Rx  Name  Route  Sig  Dispense  Refill  . Aspirin-Salicylamide-Caffeine (BC HEADACHE POWDER PO)   Oral   Take by mouth as needed. PT states she takes PRN for migraines         . ibuprofen (ADVIL,MOTRIN) 200 MG tablet   Oral   Take 800 mg by mouth every 6  (six) hours as needed. For migraine         . Pseudoeph-Doxylamine-DM-APAP 30-6.25-15-325 MG CAPS   Oral   Take 1 capsule by mouth every 6 (six) hours as needed (cold symptoms).         . SUMAtriptan (IMITREX) 50 MG tablet   Oral   Take 1 tablet (50 mg total) by mouth every 2 (two) hours as needed for migraine (Take one tablet at onset of headache, may be repeated in 2 hours.).   10 tablet   0    BP 105/43  Pulse 102  Temp(Src) 99.3 F (37.4 C) (Oral)  Resp 14  SpO2 96% Physical Exam  Nursing note and vitals reviewed. Constitutional: She is oriented to person, place, and time. She appears well-developed and well-nourished.  HENT:  Head: Normocephalic and atraumatic.  Eyes: Conjunctivae and EOM are normal. Pupils are equal, round, and reactive to light.  Neck: Normal range of motion and phonation normal. Neck supple.  There is no meningismus. She can touch her chin to her chest without pain.  Cardiovascular: Normal rate, regular rhythm and intact distal pulses.   Pulmonary/Chest: Effort normal and breath sounds normal. She exhibits no tenderness.  Abdominal: Soft. She exhibits no distension. There is no tenderness. There is no guarding.  Musculoskeletal: Normal range of motion.  Neurological:  She is alert and oriented to person, place, and time. She has normal strength. She exhibits normal muscle tone.  Skin: Skin is warm and dry. No rash noted. No pallor.  Psychiatric: She has a normal mood and affect. Her behavior is normal. Judgment and thought content normal.    ED Course  Procedures (including critical care time)  Medications  sodium chloride 0.9 % bolus 1,000 mL (1,000 mLs Intravenous New Bag/Given 09/13/12 2254)  morphine 4 MG/ML injection 4 mg (4 mg Intravenous Given 09/13/12 2250)  ondansetron (ZOFRAN) injection 4 mg (4 mg Intravenous Given 09/13/12 2250)  acetaminophen (TYLENOL) tablet 650 mg (650 mg Oral Given 09/13/12 2253)   Patient Vitals for the past 24  hrs:  BP Temp Temp src Pulse Resp SpO2  09/14/12 0012 105/43 mmHg 99.3 F (37.4 C) Oral 102 14 96 %  09/13/12 2131 112/65 mmHg - - 122 - -  09/13/12 2130 121/73 mmHg - - 108 - -  09/13/12 2129 111/63 mmHg 100.5 F (38.1 C) Oral 109 16 96 %    12:07 AM Reevaluation with update and discussion. After initial assessment and treatment, an updated evaluation reveals she is feeling better, her headache has resolved. She was able to eat some chicken and drink some fluids. Victoria Brennan   Labs Review Labs Reviewed  CBC WITH DIFFERENTIAL - Abnormal; Notable for the following:    Lymphocytes Relative 11 (*)    Monocytes Relative 13 (*)    Monocytes Absolute 1.2 (*)    All other components within normal limits  BASIC METABOLIC PANEL - Abnormal; Notable for the following:    Glucose, Bld 100 (*)    Calcium 8.2 (*)    GFR calc non Af Amer 76 (*)    GFR calc Af Amer 88 (*)    All other components within normal limits  URINALYSIS, ROUTINE W REFLEX MICROSCOPIC - Abnormal; Notable for the following:    Hgb urine dipstick TRACE (*)    Ketones, ur 15 (*)    All other components within normal limits  URINE MICROSCOPIC-ADD ON - Abnormal; Notable for the following:    Squamous Epithelial / LPF MANY (*)    Bacteria, UA MANY (*)    All other components within normal limits  URINE CULTURE  PREGNANCY, URINE   Imaging Review No results found.  MDM   1. Viral illness      Nonspecific viral illness, without evidence for meningitis, encephalitis, sinusitis, pneumonia or urinary tract infection. She is stable for discharge with outpatient management.    Nursing Notes Reviewed/ Care Coordinated, and agree without changes. Applicable Imaging Reviewed.  Interpretation of Laboratory Data incorporated into ED treatment   Plan: Home Medications- antipyretic; Home Treatments and Observation- rest, fluids, watch for progressive symptoms; return here if the recommended treatment, does not improve the  symptoms; Recommended follow up- PCP or return here when necessary     Flint Melter, MD 09/14/12 [phone]

## 2012-09-13 NOTE — ED Notes (Signed)
Patient is alert and oriented x3.   She is complaining of generalized body aches and fatigue that started two days ago. She states that she had a near syncopal episode this afternoon at [facility].

## 2012-09-14 LAB — URINE MICROSCOPIC-ADD ON

## 2012-09-14 LAB — URINALYSIS, ROUTINE W REFLEX MICROSCOPIC
Bilirubin Urine: NEGATIVE
Glucose, UA: NEGATIVE mg/dL
Specific Gravity, Urine: 1.013 (ref 1.005–1.030)
pH: 6 (ref 5.0–8.0)

## 2012-09-14 NOTE — ED Notes (Signed)
Patient is alert and oriented x3.  She was given DC instructions and follow up visit instructions.  Patient gave verbal understanding. She was DC ambulatory under her own power to home.  V/S stable.  He was not showing any signs of distress on DC 

## 2012-09-16 LAB — URINE CULTURE

## 2012-09-18 NOTE — ED Notes (Signed)
No treatment needed per Lala Lund.

## 2012-10-12 ENCOUNTER — Emergency Department (HOSPITAL_BASED_OUTPATIENT_CLINIC_OR_DEPARTMENT_OTHER)
Admission: EM | Admit: 2012-10-12 | Discharge: 2012-10-13 | Disposition: A | Discharge status: Home / Self Care | Payer: BC Managed Care – PPO | Attending: Emergency Medicine | Admitting: Emergency Medicine

## 2012-10-12 ENCOUNTER — Encounter (HOSPITAL_BASED_OUTPATIENT_CLINIC_OR_DEPARTMENT_OTHER): Payer: Self-pay | Admitting: *Deleted

## 2012-10-12 DIAGNOSIS — W57XXXA Bitten or stung by nonvenomous insect and other nonvenomous arthropods, initial encounter: Secondary | ICD-10-CM | POA: Insufficient documentation

## 2012-10-12 DIAGNOSIS — Z79899 Other long term (current) drug therapy: Secondary | ICD-10-CM | POA: Insufficient documentation

## 2012-10-12 DIAGNOSIS — Z8659 Personal history of other mental and behavioral disorders: Secondary | ICD-10-CM | POA: Insufficient documentation

## 2012-10-12 DIAGNOSIS — Y939 Activity, unspecified: Secondary | ICD-10-CM | POA: Insufficient documentation

## 2012-10-12 DIAGNOSIS — Y929 Unspecified place or not applicable: Secondary | ICD-10-CM | POA: Insufficient documentation

## 2012-10-12 DIAGNOSIS — S40862A Insect bite (nonvenomous) of left upper arm, initial encounter: Secondary | ICD-10-CM

## 2012-10-12 DIAGNOSIS — S40269A Insect bite (nonvenomous) of unspecified shoulder, initial encounter: Secondary | ICD-10-CM | POA: Insufficient documentation

## 2012-10-12 DIAGNOSIS — G43909 Migraine, unspecified, not intractable, without status migrainosus: Secondary | ICD-10-CM | POA: Insufficient documentation

## 2012-10-12 MED ORDER — SULFAMETHOXAZOLE-TMP DS 800-160 MG PO TABS
1.0000 | ORAL_TABLET | Freq: Once | ORAL | Status: AC
  Administered 2012-10-13: 1 via ORAL
  Filled 2012-10-12: qty 1

## 2012-10-12 MED ORDER — PREDNISONE 50 MG PO TABS
60.0000 mg | ORAL_TABLET | Freq: Once | ORAL | Status: AC
  Administered 2012-10-13: 60 mg via ORAL
  Filled 2012-10-12 (×2): qty 1

## 2012-10-12 MED ORDER — PREDNISONE 50 MG PO TABS: 60.0000 mg | ORAL_TABLET | Freq: Every day | ORAL | Status: DC

## 2012-10-12 NOTE — ED Provider Notes (Signed)
CSN: [id number]     Arrival date & time 10/12/12  2214 History   First MD Initiated Contact with Patient 10/12/12 2338     Chief Complaint  Patient presents with  . Insect Bite   (Consider location/radiation/quality/duration/timing/severity/associated sxs/prior Treatment) Patient is a 32 y.o. female presenting with arm injury. The history is provided by the patient. No language interpreter was used.  Arm Injury Location:  Arm Arm location:  L arm Pain details:    Quality:  Aching   Radiates to:  Does not radiate   Severity:  Moderate   Onset quality:  Gradual   Timing:  Constant   Progression:  Worsening Chronicity:  New Dislocation: no   Worsened by:  Nothing tried Ineffective treatments:  Acetaminophen Pt complains of a large bite to left arm.  Pt reports she has had multiple insect bites in the past year.  Pt reports she has a bug problem in her apartment.  Past Medical History  Diagnosis Date  . Persistent headaches   . Anxiety   . Depression   . Migraine    History reviewed. No pertinent past surgical history. Family History  Problem Relation Age of Onset  . Hypertension Mother   . Heart disease Mother   . Hypertension Maternal Grandmother   . Diabetes Maternal Grandmother   . Cancer Paternal Grandmother   . Heart disease Paternal Grandfather   . Hypertension Father   . Depression Brother    History  Substance Use Topics  . Smoking status: Never Smoker   . Smokeless tobacco: Never Used  . Alcohol Use: 1.1 oz/week    1 Glasses of wine, 1 Drinks containing 0.5 oz of alcohol per week   OB History   Grav Para Term Preterm Abortions TAB SAB Ect Mult Living                 Review of Systems  Musculoskeletal: Positive for myalgias. Negative for joint swelling.  Skin: Positive for color change.  All other systems reviewed and are negative.    Allergies  Review of patient's allergies indicates no known allergies.  Home Medications   Current Outpatient  Rx  Name  Route  Sig  Dispense  Refill  . Aspirin-Salicylamide-Caffeine (BC HEADACHE POWDER PO)   Oral   Take by mouth as needed. PT states she takes PRN for migraines         . ibuprofen (ADVIL,MOTRIN) 200 MG tablet   Oral   Take 800 mg by mouth every 6 (six) hours as needed. For migraine         . Pseudoeph-Doxylamine-DM-APAP 30-6.25-15-325 MG CAPS   Oral   Take 1 capsule by mouth every 6 (six) hours as needed (cold symptoms).         . SUMAtriptan (IMITREX) 50 MG tablet   Oral   Take 1 tablet (50 mg total) by mouth every 2 (two) hours as needed for migraine (Take one tablet at onset of headache, may be repeated in 2 hours.).   10 tablet   0    BP 129/74  Pulse 93  Temp(Src) 98.1 F (36.7 C) (Oral)  Resp 16  Ht 5\' 2"  (1.575 m)  Wt 175 lb (79.379 kg)  BMI 32 kg/m2  SpO2 100%  LMP 10/01/2012 Physical Exam  Vitals reviewed. Constitutional: She is oriented to person, place, and time. She appears well-developed and well-nourished.  HENT:  Head: Normocephalic.  Eyes: Pupils are equal, round, and reactive to light.  Neck:  Normal range of motion.  Cardiovascular: Normal rate.   Pulmonary/Chest: Effort normal.  Abdominal: Soft.  Musculoskeletal: She exhibits tenderness.  15cm area of erythema warm to touch left arm  Neurological: She is alert and oriented to person, place, and time. She has normal reflexes.  Skin: Skin is warm. There is erythema.  Psychiatric: She has a normal mood and affect.    ED Course  Procedures (including critical care time) Labs Review Labs Reviewed - No data to display Imaging Review No results found.  MDM   1. Insect bite of arm, left, initial encounter        Elson Areas, PA-C 10/13/12 0004

## 2012-10-12 NOTE — ED Notes (Signed)
Pt reports she was bitten by insect on left arm today- reports similar bites over past year- took [facility] powder for pain- left arm with erythema and swelling

## 2012-10-13 MED ORDER — PREDNISONE 10 MG PO TABS: ORAL_TABLET | ORAL | Status: DC

## 2012-10-13 MED ORDER — SULFAMETHOXAZOLE-TRIMETHOPRIM 800-160 MG PO TABS: 1.0000 | ORAL_TABLET | Freq: Two times a day (BID) | ORAL | Status: AC

## 2012-10-13 NOTE — ED Provider Notes (Signed)
Medical screening examination/treatment/procedure(s) were performed by non-physician practitioner and as supervising physician I was immediately available for consultation/collaboration.  Geoffery Lyons, MD 10/13/12 [phone]

## 2013-03-24 ENCOUNTER — Emergency Department (HOSPITAL_BASED_OUTPATIENT_CLINIC_OR_DEPARTMENT_OTHER)
Admission: EM | Admit: 2013-03-24 | Discharge: 2013-03-25 | Disposition: A | Discharge status: Home / Self Care | Payer: BC Managed Care – PPO | Attending: Emergency Medicine | Admitting: Emergency Medicine

## 2013-03-24 ENCOUNTER — Encounter (HOSPITAL_BASED_OUTPATIENT_CLINIC_OR_DEPARTMENT_OTHER): Payer: Self-pay | Admitting: Emergency Medicine

## 2013-03-24 DIAGNOSIS — Z3202 Encounter for pregnancy test, result negative: Secondary | ICD-10-CM | POA: Insufficient documentation

## 2013-03-24 DIAGNOSIS — R6883 Chills (without fever): Secondary | ICD-10-CM | POA: Insufficient documentation

## 2013-03-24 DIAGNOSIS — G43909 Migraine, unspecified, not intractable, without status migrainosus: Secondary | ICD-10-CM | POA: Insufficient documentation

## 2013-03-24 DIAGNOSIS — R638 Other symptoms and signs concerning food and fluid intake: Secondary | ICD-10-CM | POA: Insufficient documentation

## 2013-03-24 DIAGNOSIS — R197 Diarrhea, unspecified: Secondary | ICD-10-CM | POA: Insufficient documentation

## 2013-03-24 DIAGNOSIS — Z8659 Personal history of other mental and behavioral disorders: Secondary | ICD-10-CM | POA: Insufficient documentation

## 2013-03-24 DIAGNOSIS — N390 Urinary tract infection, site not specified: Secondary | ICD-10-CM | POA: Insufficient documentation

## 2013-03-24 LAB — URINALYSIS, ROUTINE W REFLEX MICROSCOPIC
Bilirubin Urine: NEGATIVE
GLUCOSE, UA: NEGATIVE mg/dL
Hgb urine dipstick: NEGATIVE
KETONES UR: NEGATIVE mg/dL
Nitrite: NEGATIVE
PH: 7 (ref 5.0–8.0)
Protein, ur: 30 mg/dL — AB
SPECIFIC GRAVITY, URINE: 1.022 (ref 1.005–1.030)
Urobilinogen, UA: 1 mg/dL (ref 0.0–1.0)

## 2013-03-24 LAB — URINE MICROSCOPIC-ADD ON

## 2013-03-24 LAB — PREGNANCY, URINE: PREG TEST UR: NEGATIVE

## 2013-03-24 MED ORDER — SUCRALFATE 1 G PO TABS
1.0000 g | ORAL_TABLET | Freq: Once | ORAL | Status: AC
  Administered 2013-03-24: 1 g via ORAL
  Filled 2013-03-24: qty 1

## 2013-03-24 NOTE — ED Provider Notes (Signed)
CSN: [id number]     Arrival date & time 03/24/13  2310 History  This chart was scribed for Hanley Seamen, MD by Dorothey Baseman, ED Scribe. This patient was seen in room MH01/MH01 and the patient's care was started at 11:32 PM.    Chief Complaint  Patient presents with  . Abdominal Pain   The history is provided by the patient. No language interpreter was used.   HPI Comments: DIAVIAN FURGASON is a 33 y.o. female who presents to the Emergency Department complaining of an intermittent, sharp pain to the periumbilical and suprapubic region of the abdomen with associated nausea, a few episodes of diarrhea, and chills onset 2 days ago. She states that the pain is worse over the suprapubic region and is exacerbated with eating with associated decreased appetite. Patient reports some right flank pain that she states presents with urination. She denies vaginal bleeding or discharge, vomiting, fever. She also reports some intermittently increased bowel sounds that she states has been ongoing for the past 2 years. She denies taking any OTC acid reflux medications at home. Patient has a history of persistent headaches, migraines, anxiety, and depression.  Past Medical History  Diagnosis Date  . Persistent headaches   . Anxiety   . Depression   . Migraine    No past surgical history on file. Family History  Problem Relation Age of Onset  . Hypertension Mother   . Heart disease Mother   . Hypertension Maternal Grandmother   . Diabetes Maternal Grandmother   . Cancer Paternal Grandmother   . Heart disease Paternal Grandfather   . Hypertension Father   . Depression Brother    History  Substance Use Topics  . Smoking status: Never Smoker   . Smokeless tobacco: Never Used  . Alcohol Use: 1.1 oz/week    1 Glasses of wine, 1 Drinks containing 0.5 oz of alcohol per week   OB History   Grav Para Term Preterm Abortions TAB SAB Ect Mult Living                 Review of Systems  A complete 10 system  review of systems was obtained and all systems are negative except as noted in the HPI and PMH.   Allergies  Review of patient's allergies indicates no known allergies.  Home Medications   Current Outpatient Rx  Name  Route  Sig  Dispense  Refill  . Aspirin-Salicylamide-Caffeine (BC HEADACHE POWDER PO)   Oral   Take by mouth as needed. PT states she takes PRN for migraines         . ibuprofen (ADVIL,MOTRIN) 200 MG tablet   Oral   Take 800 mg by mouth every 6 (six) hours as needed. For migraine         . predniSONE (DELTASONE) 10 MG tablet      5,4,3,2,1 taper   15 tablet   0   . Pseudoeph-Doxylamine-DM-APAP 30-6.25-15-325 MG CAPS   Oral   Take 1 capsule by mouth every 6 (six) hours as needed (cold symptoms).         . SUMAtriptan (IMITREX) 50 MG tablet   Oral   Take 1 tablet (50 mg total) by mouth every 2 (two) hours as needed for migraine (Take one tablet at onset of headache, may be repeated in 2 hours.).   10 tablet   0    Triage Vitals: BP 130/68  Pulse 91  Temp(Src) 98.1 F (36.7 C) (Oral)  Resp 16  Ht 5\' 2"  (1.575 m)  Wt 175 lb (79.379 kg)  BMI 32.00 kg/m2  SpO2 100%  LMP 03/18/2013  Physical Exam  Nursing note and vitals reviewed. Constitutional: She is oriented to person, place, and time. She appears well-developed and well-nourished. No distress.  HENT:  Head: Normocephalic and atraumatic.  Eyes: Conjunctivae and EOM are normal. Pupils are equal, round, and reactive to light.  Neck: Normal range of motion. Neck supple.  Cardiovascular: Normal rate, regular rhythm and normal heart sounds.   Pulmonary/Chest: Effort normal and breath sounds normal. No respiratory distress.  Abdominal: Soft. Bowel sounds are normal. She exhibits no distension and no mass. There is no tenderness. There is no rebound and no guarding.  Genitourinary:  No CVA tenderness.   Musculoskeletal: Normal range of motion.  Neurological: She is alert and oriented to person,  place, and time.  Skin: Skin is warm and dry.  Psychiatric: She has a normal mood and affect. Her behavior is normal.    ED Course  Procedures (including critical care time)  DIAGNOSTIC STUDIES: Oxygen Saturation is 100% on room air, normal by my interpretation.    COORDINATION OF CARE: 11:35 PM- Ordered UA. Discussed treatment plan with patient at bedside and patient verbalized agreement.    MDM   Nursing notes and vitals signs, including pulse oximetry, reviewed.  Summary of this visit's results, reviewed by myself:  Labs:  Results for orders placed during the hospital encounter of 03/24/13 (from the past 24 hour(s))  URINALYSIS, ROUTINE W REFLEX MICROSCOPIC     Status: Abnormal   Collection Time    03/24/13 11:20 PM      Result Value Ref Range   Color, Urine YELLOW  YELLOW   APPearance CLOUDY (*) CLEAR   Specific Gravity, Urine 1.022  1.005 - 1.030   pH 7.0  5.0 - 8.0   Glucose, UA NEGATIVE  NEGATIVE mg/dL   Hgb urine dipstick NEGATIVE  NEGATIVE   Bilirubin Urine NEGATIVE  NEGATIVE   Ketones, ur NEGATIVE  NEGATIVE mg/dL   Protein, ur 30 (*) NEGATIVE mg/dL   Urobilinogen, UA 1.0  0.0 - 1.0 mg/dL   Nitrite NEGATIVE  NEGATIVE   Leukocytes, UA LARGE (*) NEGATIVE  PREGNANCY, URINE     Status: None   Collection Time    03/24/13 11:20 PM      Result Value Ref Range   Preg Test, Ur NEGATIVE  NEGATIVE  URINE MICROSCOPIC-ADD ON     Status: Abnormal   Collection Time    03/24/13 11:20 PM      Result Value Ref Range   Squamous Epithelial / LPF RARE  RARE   WBC, UA 21-50  <3 WBC/hpf   RBC / HPF 0-2  <3 RBC/hpf   Bacteria, UA FEW (*) RARE   Urine-Other MUCOUS PRESENT     12:15 AM Urinalysis consistent with urinary tract infection. We'll treat accordingly. 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  I personally performed the  services described in this documentation, which was scribed in my presence.  The recorded information has been reviewed and considered.     Hanley SeamenJohn L Elaysha Bevard, MD 03/25/13 [phone]

## 2013-03-24 NOTE — ED Notes (Addendum)
Suprapubic and upper abd pain x2 days with nausea.  Pt reports that the abd pain is worse after she eats.

## 2013-03-25 MED ORDER — NITROFURANTOIN MONOHYD MACRO 100 MG PO CAPS
100.0000 mg | ORAL_CAPSULE | Freq: Once | ORAL | Status: AC
  Administered 2013-03-25: 100 mg via ORAL

## 2013-03-25 MED ORDER — NITROFURANTOIN MONOHYD MACRO 100 MG PO CAPS
ORAL_CAPSULE | ORAL | Status: AC
  Administered 2013-03-25: 100 mg via ORAL
  Filled 2013-03-25: qty 1

## 2013-03-25 MED ORDER — ONDANSETRON 8 MG PO TBDP: 8.0000 mg | ORAL_TABLET | Freq: Three times a day (TID) | ORAL | Status: AC | PRN

## 2013-03-25 MED ORDER — NITROFURANTOIN MONOHYD MACRO 100 MG PO CAPS: 100.0000 mg | ORAL_CAPSULE | Freq: Two times a day (BID) | ORAL | Status: DC

## 2013-03-26 ENCOUNTER — Other Ambulatory Visit: Payer: Self-pay | Admitting: Gastroenterology

## 2013-03-26 DIAGNOSIS — R1013 Epigastric pain: Secondary | ICD-10-CM

## 2013-04-02 ENCOUNTER — Ambulatory Visit
Admission: RE | Admit: 2013-04-02 | Discharge: 2013-04-02 | Disposition: A | Discharge status: Home / Self Care | Payer: BC Managed Care – PPO | Source: Ambulatory Visit | Attending: Gastroenterology | Admitting: Gastroenterology

## 2013-04-02 DIAGNOSIS — R1013 Epigastric pain: Secondary | ICD-10-CM

## 2013-04-10 ENCOUNTER — Emergency Department (HOSPITAL_BASED_OUTPATIENT_CLINIC_OR_DEPARTMENT_OTHER)
Admission: EM | Admit: 2013-04-10 | Discharge: 2013-04-10 | Disposition: A | Discharge status: Home / Self Care | Payer: BC Managed Care – PPO | Attending: Emergency Medicine | Admitting: Emergency Medicine

## 2013-04-10 ENCOUNTER — Encounter (HOSPITAL_BASED_OUTPATIENT_CLINIC_OR_DEPARTMENT_OTHER): Payer: Self-pay | Admitting: Emergency Medicine

## 2013-04-10 DIAGNOSIS — Y929 Unspecified place or not applicable: Secondary | ICD-10-CM | POA: Insufficient documentation

## 2013-04-10 DIAGNOSIS — F3289 Other specified depressive episodes: Secondary | ICD-10-CM | POA: Insufficient documentation

## 2013-04-10 DIAGNOSIS — Y9389 Activity, other specified: Secondary | ICD-10-CM | POA: Insufficient documentation

## 2013-04-10 DIAGNOSIS — T50905A Adverse effect of unspecified drugs, medicaments and biological substances, initial encounter: Secondary | ICD-10-CM

## 2013-04-10 DIAGNOSIS — G43909 Migraine, unspecified, not intractable, without status migrainosus: Secondary | ICD-10-CM | POA: Insufficient documentation

## 2013-04-10 DIAGNOSIS — T43201A Poisoning by unspecified antidepressants, accidental (unintentional), initial encounter: Secondary | ICD-10-CM | POA: Insufficient documentation

## 2013-04-10 DIAGNOSIS — F329 Major depressive disorder, single episode, unspecified: Secondary | ICD-10-CM | POA: Insufficient documentation

## 2013-04-10 DIAGNOSIS — Z79899 Other long term (current) drug therapy: Secondary | ICD-10-CM | POA: Insufficient documentation

## 2013-04-10 DIAGNOSIS — T43224A Poisoning by selective serotonin reuptake inhibitors, undetermined, initial encounter: Secondary | ICD-10-CM | POA: Insufficient documentation

## 2013-04-10 DIAGNOSIS — F411 Generalized anxiety disorder: Secondary | ICD-10-CM | POA: Insufficient documentation

## 2013-04-10 NOTE — ED Provider Notes (Signed)
CSN: [id number]     Arrival date & time 04/10/13  1054 History   First MD Initiated Contact with Patient 04/10/13 1058     Chief Complaint  Patient presents with  . Dizziness     (Consider location/radiation/quality/duration/timing/severity/associated sxs/prior Treatment) Patient is a 33 y.o. female presenting with dizziness. The history is provided by the patient.  Dizziness  patient here after having a sudden onset of becoming anxious with associated crying and trouble speaking. Recently started back on her Zoloft. Denies any suicidal or homicidal ideations. No recent illnesses. Denies any fever or chills. No severe headaches. No visual changes. Symptoms have gradually improved. No treatment was used for this prior to arrival. Nothing seemed to make her symptoms better.  Past Medical History  Diagnosis Date  . Persistent headaches   . Anxiety   . Depression   . Migraine    History reviewed. No pertinent past surgical history. Family History  Problem Relation Age of Onset  . Hypertension Mother   . Heart disease Mother   . Hypertension Maternal Grandmother   . Diabetes Maternal Grandmother   . Cancer Paternal Grandmother   . Heart disease Paternal Grandfather   . Hypertension Father   . Depression Brother    History  Substance Use Topics  . Smoking status: Never Smoker   . Smokeless tobacco: Never Used  . Alcohol Use: 1.1 oz/week    1 Glasses of wine, 1 Drinks containing 0.5 oz of alcohol per week   OB History   Grav Para Term Preterm Abortions TAB SAB Ect Mult Living                 Review of Systems  Neurological: Positive for dizziness.  All other systems reviewed and are negative.      Allergies  Review of patient's allergies indicates no known allergies.  Home Medications   Current Outpatient Rx  Name  Route  Sig  Dispense  Refill  . gabapentin (NEURONTIN) 300 MG capsule   Oral   Take 300 mg by mouth 3 (three) times daily.         . sertraline  (ZOLOFT) 50 MG tablet   Oral   Take 50 mg by mouth daily.         . Aspirin-Salicylamide-Caffeine (BC HEADACHE POWDER PO)   Oral   Take by mouth as needed. PT states she takes PRN for migraines         . ibuprofen (ADVIL,MOTRIN) 200 MG tablet   Oral   Take 800 mg by mouth every 6 (six) hours as needed. For migraine         . nitrofurantoin, macrocrystal-monohydrate, (MACROBID) 100 MG capsule   Oral   Take 1 capsule (100 mg total) by mouth 2 (two) times daily. X 7 days   14 capsule   0   . ondansetron (ZOFRAN ODT) 8 MG disintegrating tablet   Oral   Take 1 tablet (8 mg total) by mouth every 8 (eight) hours as needed for nausea or vomiting.   10 tablet   0   . predniSONE (DELTASONE) 10 MG tablet      5,4,3,2,1 taper   15 tablet   0   . Pseudoeph-Doxylamine-DM-APAP 30-6.25-15-325 MG CAPS   Oral   Take 1 capsule by mouth every 6 (six) hours as needed (cold symptoms).         . SUMAtriptan (IMITREX) 50 MG tablet   Oral   Take 1 tablet (50 mg total)  by mouth every 2 (two) hours as needed for migraine (Take one tablet at onset of headache, may be repeated in 2 hours.).   10 tablet   0    BP 131/70  Pulse 95  Temp(Src) 98.3 F (36.8 C) (Oral)  Resp 19  SpO2 100%  LMP 03/18/2013 Physical Exam  Nursing note and vitals reviewed. Constitutional: She is oriented to person, place, and time. She appears well-developed and well-nourished.  Non-toxic appearance. No distress.  HENT:  Head: Normocephalic and atraumatic.  Eyes: Conjunctivae, EOM and lids are normal. Pupils are equal, round, and reactive to light.  Neck: Normal range of motion. Neck supple. No tracheal deviation present. No mass present.  Cardiovascular: Normal rate, regular rhythm and normal heart sounds.  Exam reveals no gallop.   No murmur heard. Pulmonary/Chest: Effort normal and breath sounds normal. No stridor. No respiratory distress. She has no decreased breath sounds. She has no wheezes. She has  no rhonchi. She has no rales.  Abdominal: Soft. Normal appearance and bowel sounds are normal. She exhibits no distension. There is no tenderness. There is no rebound and no CVA tenderness.  Musculoskeletal: Normal range of motion. She exhibits no edema and no tenderness.  Neurological: She is alert and oriented to person, place, and time. She has normal strength. No cranial nerve deficit or sensory deficit. GCS eye subscore is 4. GCS verbal subscore is 5. GCS motor subscore is 6.  Skin: Skin is warm and dry. No abrasion and no rash noted.  Psychiatric: She has a normal mood and affect. Her speech is normal and behavior is normal.    ED Course  Procedures (including critical care time) Labs Review Labs Reviewed - No data to display Imaging Review No results found.   EKG Interpretation None      MDM   Final diagnoses:  None    Patient reports that she has had decreased sleep and exhaustion recently. She also took a 50 mg Zoloft tablets for the first time today. States that normally she takes 20 mg. States that she had a wedding psychomotor her and she feels much better now she is calm down. She will followup with her Dr. next week    Toy BakerAnthony T Kia Varnadore, MD 04/10/13 1116

## 2013-04-10 NOTE — Discharge Instructions (Signed)
Call your doctor on Monday for dosing recommendations on her Zoloft. Do not take another dose of Zoloft until you speak with that person

## 2013-04-10 NOTE — ED Notes (Signed)
Patient had a sudden onset of inability to speak,crying, felt as if she was going to pass out. Took 50mg  zoloft today for the first time in a month

## 2013-04-20 ENCOUNTER — Encounter: Payer: Self-pay | Admitting: Neurology

## 2013-04-30 ENCOUNTER — Encounter: Payer: Self-pay | Admitting: Neurology

## 2013-04-30 ENCOUNTER — Ambulatory Visit (INDEPENDENT_AMBULATORY_CARE_PROVIDER_SITE_OTHER): Payer: BC Managed Care – PPO | Admitting: Neurology

## 2013-04-30 ENCOUNTER — Encounter (INDEPENDENT_AMBULATORY_CARE_PROVIDER_SITE_OTHER): Payer: Self-pay

## 2013-04-30 VITALS — BP 118/75 | HR 79 | Temp 97.8°F | Ht 62.0 in | Wt 173.0 lb

## 2013-04-30 DIAGNOSIS — R0609 Other forms of dyspnea: Secondary | ICD-10-CM

## 2013-04-30 DIAGNOSIS — F3289 Other specified depressive episodes: Secondary | ICD-10-CM

## 2013-04-30 DIAGNOSIS — F329 Major depressive disorder, single episode, unspecified: Secondary | ICD-10-CM

## 2013-04-30 DIAGNOSIS — F32A Depression, unspecified: Secondary | ICD-10-CM

## 2013-04-30 DIAGNOSIS — R51 Headache: Secondary | ICD-10-CM

## 2013-04-30 DIAGNOSIS — R404 Transient alteration of awareness: Secondary | ICD-10-CM

## 2013-04-30 DIAGNOSIS — R519 Headache, unspecified: Secondary | ICD-10-CM

## 2013-04-30 DIAGNOSIS — F419 Anxiety disorder, unspecified: Secondary | ICD-10-CM

## 2013-04-30 DIAGNOSIS — E669 Obesity, unspecified: Secondary | ICD-10-CM

## 2013-04-30 DIAGNOSIS — F411 Generalized anxiety disorder: Secondary | ICD-10-CM

## 2013-04-30 DIAGNOSIS — R0683 Snoring: Secondary | ICD-10-CM

## 2013-04-30 DIAGNOSIS — R0989 Other specified symptoms and signs involving the circulatory and respiratory systems: Secondary | ICD-10-CM

## 2013-04-30 DIAGNOSIS — R400 Somnolence: Secondary | ICD-10-CM
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
## 2013-04-30 NOTE — Patient Instructions (Addendum)
We will do sleep study testing for your severe sleepiness.  Do not restart your adderall or zoloft until AFTER the sleep tests are completed.  DO NOT drive when sleepy. If you plan to drive for more than 20 minutes, pull over and take a break/rest/nap.

## 2013-06-08 ENCOUNTER — Telehealth: Payer: Self-pay | Admitting: Neurology

## 2013-06-08 NOTE — Telephone Encounter (Signed)
Please advise patient that she was given instruction at the time of her appointment with me. She had been instructed NOT to start her stimulant medication and as she had been off of her antidepressant she was advised NOT to start her antidepressant until after the sleep studies were completed. There was no other instruction needed at the time and she was given these instructions at the time of her visit.

## 2013-06-08 NOTE — Telephone Encounter (Signed)
Pt calls to cancel MSLT scheduled for 06/11/2013 because she did not receive a weaning schedule to come off of medications.  Complains that no one from the office called to explain to her what she needed to do.  I returned a call to the patient and left a message on voicemail.  Due to several on hold appt's, this patients appt for the MSLT was cancelled due to likelihood that she has not weaned in preparation for testing.

## 2013-06-10 NOTE — Telephone Encounter (Signed)
I returned patient's call about sleep study. Patient stated she needed to cancel her sleep study for tonight (06-10-2013).  I informed the patient that she wasn't scheduled tonightI inform the patient of Dr. Teofilo Pod note about NOT to start her stimulant nor antidepressant medications until after the sleep studies were completed.. I ask the patient if she had taken her stimulant medication and her antidepressant. Patient stated she had not taken any medication because she was unsure about the instruction. Patient has been scheduled for her sleep study.

## 2013-06-16 ENCOUNTER — Telehealth: Payer: Self-pay | Admitting: Neurology

## 2013-06-16 NOTE — Telephone Encounter (Signed)
Pt called to reschedule her NPSG/MSLT that was scheduled for 6/4 and 6/5 because she has to work on 6/5 and can't find anyone to cover for her.  The only available NPSG/MSLT that she could be rescheduled in was 7/28 and 7/29.  Because the appointments are far away, the patient would like to know if she can begin taking the Zoloft and Adderall.  If so, many days in advance does she need to stop taking the med prior to the test?

## 2013-06-18 NOTE — Telephone Encounter (Signed)
Called pt and left message for pt to call back.

## 2013-06-18 NOTE — Telephone Encounter (Signed)
Called pt to inform her per Dr. Frances Furbish that it was ok for pt to start Zoloft and Adderall now, but to be off of Adderall 7-10 days prior to testing and taper off the Zoloft and be completely off of it for 14 days prior to sleep studies. I advised the pt that if she has any other problems, questions or concerns to call the office. Pt verbalized understanding.

## 2013-06-18 NOTE — Telephone Encounter (Signed)
Patient is returning your call.  

## 2013-06-18 NOTE — Telephone Encounter (Signed)
She can start Zoloft and Adderall.  She would have to be off of Adderall 7-10 days prior to testing and taper off the Zoloft and be completely off of it for 14 days prior to sleep studies. Thx, [facility] pool, please call pt.

## 2013-10-08 ENCOUNTER — Encounter (HOSPITAL_BASED_OUTPATIENT_CLINIC_OR_DEPARTMENT_OTHER): Payer: Self-pay | Admitting: Emergency Medicine

## 2013-10-08 ENCOUNTER — Emergency Department (HOSPITAL_BASED_OUTPATIENT_CLINIC_OR_DEPARTMENT_OTHER): Payer: BC Managed Care – PPO

## 2013-10-08 ENCOUNTER — Emergency Department (HOSPITAL_BASED_OUTPATIENT_CLINIC_OR_DEPARTMENT_OTHER)
Admission: EM | Admit: 2013-10-08 | Discharge: 2013-10-08 | Disposition: A | Discharge status: Home / Self Care | Payer: BC Managed Care – PPO | Attending: Emergency Medicine | Admitting: Emergency Medicine

## 2013-10-08 DIAGNOSIS — Z7982 Long term (current) use of aspirin: Secondary | ICD-10-CM | POA: Insufficient documentation

## 2013-10-08 DIAGNOSIS — R51 Headache: Secondary | ICD-10-CM | POA: Insufficient documentation

## 2013-10-08 DIAGNOSIS — K5900 Constipation, unspecified: Secondary | ICD-10-CM | POA: Insufficient documentation

## 2013-10-08 DIAGNOSIS — F329 Major depressive disorder, single episode, unspecified: Secondary | ICD-10-CM | POA: Insufficient documentation

## 2013-10-08 DIAGNOSIS — F3289 Other specified depressive episodes: Secondary | ICD-10-CM | POA: Insufficient documentation

## 2013-10-08 DIAGNOSIS — Z3202 Encounter for pregnancy test, result negative: Secondary | ICD-10-CM | POA: Insufficient documentation

## 2013-10-08 DIAGNOSIS — Z79899 Other long term (current) drug therapy: Secondary | ICD-10-CM | POA: Insufficient documentation

## 2013-10-08 DIAGNOSIS — K5641 Fecal impaction: Secondary | ICD-10-CM

## 2013-10-08 DIAGNOSIS — F411 Generalized anxiety disorder: Secondary | ICD-10-CM | POA: Insufficient documentation

## 2013-10-08 DIAGNOSIS — G43909 Migraine, unspecified, not intractable, without status migrainosus: Secondary | ICD-10-CM | POA: Insufficient documentation

## 2013-10-08 LAB — URINALYSIS, ROUTINE W REFLEX MICROSCOPIC
Bilirubin Urine: NEGATIVE
GLUCOSE, UA: NEGATIVE mg/dL
HGB URINE DIPSTICK: NEGATIVE
KETONES UR: NEGATIVE mg/dL
Leukocytes, UA: NEGATIVE
Nitrite: NEGATIVE
PROTEIN: NEGATIVE mg/dL
Specific Gravity, Urine: 1.006 (ref 1.005–1.030)
Urobilinogen, UA: 0.2 mg/dL (ref 0.0–1.0)
pH: 6 (ref 5.0–8.0)

## 2013-10-08 LAB — PREGNANCY, URINE: Preg Test, Ur: NEGATIVE

## 2013-10-08 MED ORDER — BISACODYL 10 MG RE SUPP
RECTAL | Status: AC
  Administered 2013-10-08: 10 mg via RECTAL
  Filled 2013-10-08: qty 1

## 2013-10-08 MED ORDER — BISACODYL 10 MG RE SUPP
10.0000 mg | Freq: Once | RECTAL | Status: AC
  Administered 2013-10-08: 10 mg via RECTAL

## 2013-10-08 NOTE — ED Provider Notes (Signed)
CSN: [id number]     Arrival date & time 10/08/13  0015 History   First MD Initiated Contact with Patient 10/08/13 0129     Chief Complaint  Patient presents with  . Constipation     (Consider location/radiation/quality/duration/timing/severity/associated sxs/prior Treatment) HPI This is a 33 year old female who was treated for urinary tract infection about 3 weeks ago. She is here with what she describes as a severe urge to defecate. She thinks her last bowel movement was 3-4 days ago. She states it feels like she has a baby's head in her rectum and she needs to give birth. She denies abdominal pain at this time although she did have some abdominal pain earlier. She is taking a stool softener without relief. She has not been vomiting and is not currently nauseated but has had transient nausea.  Past Medical History  Diagnosis Date  . Persistent headaches   . Anxiety   . Depression   . Migraine    History reviewed. No pertinent past surgical history. Family History  Problem Relation Age of Onset  . Hypertension Mother   . Heart disease Mother   . Hypertension Maternal Grandmother   . Diabetes Maternal Grandmother   . Cancer Paternal Grandmother   . Heart disease Paternal Grandfather   . Hypertension Father   . Depression Brother   . Insomnia Mother   . Insomnia Father    History  Substance Use Topics  . Smoking status: Never Smoker   . Smokeless tobacco: Never Used  . Alcohol Use: 1.1 oz/week    1 Glasses of wine, 1 Drinks containing 0.5 oz of alcohol per week   OB History   Grav Para Term Preterm Abortions TAB SAB Ect Mult Living                 Review of Systems  All other systems reviewed and are negative.   Allergies  Review of patient's allergies indicates no known allergies.  Home Medications   Prior to Admission medications   Medication Sig Start Date End Date Taking? Authorizing Provider  amphetamine-dextroamphetamine (ADDERALL XR) 25 MG 24 hr capsule  Take 25 mg by mouth every morning.    Historical Provider, MD  Aspirin-Salicylamide-Caffeine (BC HEADACHE POWDER PO) Take by mouth as needed. PT states she takes PRN for migraines    Historical Provider, MD  gabapentin (NEURONTIN) 300 MG capsule Take 300 mg by mouth 3 (three) times daily.    Historical Provider, MD  ibuprofen (ADVIL,MOTRIN) 200 MG tablet Take 800 mg by mouth every 6 (six) hours as needed. For migraine    Historical Provider, MD  omeprazole (PRILOSEC) 20 MG capsule Take 20 mg by mouth daily.    Historical Provider, MD  ondansetron (ZOFRAN ODT) 8 MG disintegrating tablet Take 1 tablet (8 mg total) by mouth every 8 (eight) hours as needed for nausea or vomiting. 03/25/13   Carlisle Beers Danell Vazquez, MD  sertraline (ZOLOFT) 50 MG tablet Take 50 mg by mouth daily.    Historical Provider, MD   BP 139/67  Pulse 94  Temp(Src) 98.7 F (37.1 C) (Oral)  Resp 18  Ht  (1.575 m)  Wt 173 lb (78.472 kg)  BMI 31.63 kg/m2  SpO2 100%  Physical Exam General: Well-developed, well-nourished female in no acute distress; appearance consistent with age of record HENT: normocephalic; atraumatic Eyes: pupils equal, round and reactive to light; extraocular muscles intact Neck: supple Heart: regular rate and rhythm; no murmurs, rubs or gallops Lungs: clear to  auscultation bilaterally Abdomen: soft; nondistended; mild suprapubic tenderness; no masses or hepatosplenomegaly; bowel sounds present GU: No CVA tenderness Rectal: No rectal tenderness; normal sphincter tone; high impaction Extremities: No deformity; full range of motion; pulses normal Neurologic: Awake, alert and oriented; motor function intact in all extremities and symmetric; no facial droop Skin: Warm and dry Psychiatric: Normal mood and affect    ED Course  Procedures (including critical care time)   MDM   Nursing notes and vitals signs, including pulse oximetry, reviewed.  Summary of this visit's results, reviewed by  myself:  Labs:  Results for orders placed during the hospital encounter of 10/08/13 (from the past 24 hour(s))  URINALYSIS, ROUTINE W REFLEX MICROSCOPIC     Status: None   Collection Time    10/08/13  1:10 AM      Result Value Ref Range   Color, Urine YELLOW  YELLOW   APPearance CLEAR  CLEAR   Specific Gravity, Urine 1.006  1.005 - 1.030   pH 6.0  5.0 - 8.0   Glucose, UA NEGATIVE  NEGATIVE mg/dL   Hgb urine dipstick NEGATIVE  NEGATIVE   Bilirubin Urine NEGATIVE  NEGATIVE   Ketones, ur NEGATIVE  NEGATIVE mg/dL   Protein, ur NEGATIVE  NEGATIVE mg/dL   Urobilinogen, UA 0.2  0.0 - 1.0 mg/dL   Nitrite NEGATIVE  NEGATIVE   Leukocytes, UA NEGATIVE  NEGATIVE  PREGNANCY, URINE     Status: None   Collection Time    10/08/13  1:10 AM      Result Value Ref Range   Preg Test, Ur NEGATIVE  NEGATIVE    Imaging Studies: Dg Abd 1 View  10/08/2013   CLINICAL DATA:  Constipation.  EXAM: ABDOMEN - 1 VIEW  COMPARISON:  Abdominal CT 02/23/2008  FINDINGS: Stool volume is moderate, compatible with history of constipation. There is no evidence of bowel obstruction. No concerning intra-abdominal mass effect or calcification in the mid or lower abdomen.  IMPRESSION: Moderate stool volume.  No bowel obstruction.   Electronically Signed   By: Tiburcio Pea M.D.   On: 10/08/2013 01:53   2:20 AM Significant relief after enema. Patient states she is supposed to be on MiraLax daily but has not been taking it. She was advised to restart.     Hanley Seamen, MD 10/08/13 [phone]

## 2013-10-08 NOTE — Discharge Instructions (Signed)
Fecal Impaction °A fecal impaction happens when there is a large, firm amount of stool (or feces) that cannot be passed. The impacted stool is usually in the rectum, which is the lowest part of the large bowel. The impacted stool can block the colon and cause significant problems. °CAUSES  °The longer stool stays in the rectum, the harder it gets. Anything that slows down your bowel movements can lead to fecal impaction, such as: °· Constipation. This can be a long-standing (chronic) problem or can happen suddenly (acute). °· Painful conditions of the rectum, such as hemorrhoids or anal fissures. The pain of these conditions can make you try to avoid having bowel movements. °· Narcotic pain-relieving medicines, such as methadone, morphine, or codeine. °· Not drinking enough fluids. °· Inactivity and bed rest over long periods of time. °· Diseases of the brain or nervous system that damage the nerves controlling the muscles of the intestines. °SIGNS AND SYMPTOMS  °· Lack of normal bowel movements or changes in bowel patterns. °· Sense of fullness in the rectum but unable to pass stool. °· Pain or cramps in the abdominal area (often after meals). °· Thin, watery discharge from the rectum. °DIAGNOSIS  °Your health care provider may suspect that you have a fecal impaction based on your symptoms and a physical exam. This will include an exam of your rectum. Sometimes X-rays or lab testing may be needed to confirm the diagnosis and to be sure there are no other problems.  °TREATMENT  °· Initially an impaction can be removed manually. Using a gloved finger, your health care provider can remove hard stool from your rectum. °· Medicine is sometimes needed. A suppository or enema can be given in the rectum to soften the stool, which can stimulate a bowel movement. Medicines can also be given by mouth (orally). °· Though rare, surgery may be needed if the colon has torn (perforated) due to blockage. °HOME CARE INSTRUCTIONS   °· Develop regular bowel habits. This could include getting in the habit of having a bowel movement after your morning cup of coffee or after eating. Be sure to allow yourself enough time on the toilet. °· Maintain a high-fiber diet. °· Drink enough fluids to keep your urine clear or pale yellow as directed by your health care provider. °· Exercise regularly. °· If you begin to get constipated, increase the amount of fiber in your diet. Eat plenty of fruits, vegetables, whole wheat breads, bran, oatmeal, and similar products. °· Take natural fiber laxatives or other laxatives only as directed by your health care provider. °SEEK MEDICAL CARE IF:  °· You have ongoing rectal pain. °· You require enemas or suppositories more than twice a week. °· You have rectal bleeding. °· You have continued problems, or you develop abdominal pain. °· You have thin, pencil-like stools. °SEEK IMMEDIATE MEDICAL CARE IF:  °You have black or tarry stools. °MAKE SURE YOU:  °· Understand these instructions. °· Will watch your condition. °· Will get help right away if you are not doing well or get worse. °Document Released: 09/23/2003 Document Revised: 10/21/2012 Document Reviewed: 07/07/2012 °[employer]® Patient Information ©2015 [employer]. This information is not intended to replace advice given to you by your health care provider. Make sure you discuss any questions you have with your health care provider. ° °

## 2013-10-08 NOTE — ED Notes (Signed)
Patient reports that she does not remember her LBM - patient states maybe Tues. The patient states that she took 3 stool softeners in the last 3 hours with no results. Patient complaints of pain and bloating

## 2013-10-08 NOTE — ED Notes (Signed)
C/o abd bloating, nausea x 1 day,  States last bm was on Monday or tuesday

## 2014-01-23 ENCOUNTER — Encounter (HOSPITAL_COMMUNITY): Payer: Self-pay

## 2014-01-23 ENCOUNTER — Emergency Department (HOSPITAL_COMMUNITY)
Admission: EM | Admit: 2014-01-23 | Discharge: 2014-01-23 | Disposition: A | Discharge status: Home / Self Care | Payer: Self-pay | Attending: Emergency Medicine | Admitting: Emergency Medicine

## 2014-01-23 ENCOUNTER — Emergency Department (HOSPITAL_COMMUNITY): Payer: Self-pay

## 2014-01-23 DIAGNOSIS — R0789 Other chest pain: Secondary | ICD-10-CM | POA: Insufficient documentation

## 2014-01-23 DIAGNOSIS — Z8719 Personal history of other diseases of the digestive system: Secondary | ICD-10-CM

## 2014-01-23 DIAGNOSIS — G43909 Migraine, unspecified, not intractable, without status migrainosus: Secondary | ICD-10-CM | POA: Insufficient documentation

## 2014-01-23 DIAGNOSIS — K219 Gastro-esophageal reflux disease without esophagitis: Secondary | ICD-10-CM | POA: Insufficient documentation

## 2014-01-23 DIAGNOSIS — Z8659 Personal history of other mental and behavioral disorders: Secondary | ICD-10-CM | POA: Insufficient documentation

## 2014-01-23 LAB — BASIC METABOLIC PANEL
Anion gap: 8 (ref 5–15)
BUN: 16 mg/dL (ref 6–23)
CO2: 26 mmol/L (ref 19–32)
Calcium: 9.1 mg/dL (ref 8.4–10.5)
Chloride: 102 mEq/L (ref 96–112)
Creatinine, Ser: 1.21 mg/dL — ABNORMAL HIGH (ref 0.50–1.10)
GFR calc Af Amer: 68 mL/min — ABNORMAL LOW (ref >90)
GFR calc non Af Amer: 59 mL/min — ABNORMAL LOW (ref >90)
Glucose, Bld: 80 mg/dL (ref 70–99)
Potassium: 3.6 mmol/L (ref 3.5–5.1)
SODIUM: 136 mmol/L (ref 135–145)

## 2014-01-23 LAB — CBC
HCT: 36.8 % (ref 36.0–46.0)
Hemoglobin: 11.8 g/dL — ABNORMAL LOW (ref 12.0–15.0)
MCH: 28.2 pg (ref 26.0–34.0)
MCHC: 32.1 g/dL (ref 30.0–36.0)
MCV: 88 fL (ref 78.0–100.0)
Platelets: 233 10*3/uL (ref 150–400)
RBC: 4.18 MIL/uL (ref 3.87–5.11)
RDW: 13.5 % (ref 11.5–15.5)
WBC: 8.3 10*3/uL (ref 4.0–10.5)

## 2014-01-23 MED ORDER — FAMOTIDINE 20 MG PO TABS
20.0000 mg | ORAL_TABLET | Freq: Once | ORAL | Status: AC
  Administered 2014-01-23: 20 mg via ORAL
  Filled 2014-01-23: qty 1

## 2014-01-23 MED ORDER — GI COCKTAIL ~~LOC~~
30.0000 mL | Freq: Once | ORAL | Status: AC
Start: 2014-01-23 — End: 2014-01-23
  Administered 2014-01-23: 30 mL via ORAL
  Filled 2014-01-23: qty 30

## 2014-01-23 MED ORDER — PANTOPRAZOLE SODIUM 40 MG PO TBEC
40.0000 mg | DELAYED_RELEASE_TABLET | Freq: Once | ORAL | Status: AC
  Administered 2014-01-23: 40 mg via ORAL
  Filled 2014-01-23: qty 1

## 2014-01-23 MED ORDER — OMEPRAZOLE 20 MG PO CPDR: 20.0000 mg | DELAYED_RELEASE_CAPSULE | Freq: Every day | ORAL | Status: AC

## 2014-01-23 NOTE — ED Notes (Signed)
Pt alert and oriented x4. Respirations even and unlabored, bilateral symmetrical rise and fall of chest. Skin warm and dry. In no acute distress. Denies needs.   

## 2014-01-23 NOTE — ED Notes (Signed)
She states she has had epigastric/mid chest discomfort for about a week.  She states it is persistent and is beginning to radiate toward her upper back.  Her skin is normal, warm and dry and she is breathing normally.  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

## 2014-01-23 NOTE — ED Provider Notes (Signed)
CSN: [id number]     Arrival date & time 01/23/14  1607 History   First MD Initiated Contact with Patient 01/23/14 1639     Chief Complaint  Patient presents with  . Chest Pain     (Consider location/radiation/quality/duration/timing/severity/associated sxs/prior Treatment) HPI  Pt is a 34yo female with hx of persistent headaches, anxiety and depression, presenting to ED with c/o epigastric/centralized chest discomfort described as a tightness 8/10 at worst, radiating into her upper back, aching in nature. Pt states she does have acid reflux, she is suppose to take omeprazole daily but states she does not take it as prescribed and believes this could be the cause of her symptoms.  Past Medical History  Diagnosis Date  . Persistent headaches   . Anxiety   . Depression   . Migraine    No past surgical history on file. Family History  Problem Relation Age of Onset  . Hypertension Mother   . Heart disease Mother   . Hypertension Maternal Grandmother   . Diabetes Maternal Grandmother   . Cancer Paternal Grandmother   . Heart disease Paternal Grandfather   . Hypertension Father   . Depression Brother   . Insomnia Mother   . Insomnia Father    History  Substance Use Topics  . Smoking status: Never Smoker   . Smokeless tobacco: Never Used  . Alcohol Use: 1.1 oz/week    1 Glasses of wine, 1 Not specified per week   OB History    No data available     Review of Systems  Constitutional: Negative for fever and chills.  HENT: Negative for congestion and sore throat.   Respiratory: Positive for chest tightness. Negative for cough and shortness of breath.   Cardiovascular: Positive for chest pain. Negative for palpitations and leg swelling.  Gastrointestinal: Negative for nausea, vomiting, abdominal pain, diarrhea and constipation.  Musculoskeletal: Positive for back pain ( upper back). Negative for myalgias and neck pain.  All other systems reviewed and are  negative.     Allergies  Review of patient's allergies indicates no known allergies.  Home Medications   Prior to Admission medications   Medication Sig Start Date End Date Taking? Authorizing Provider  Aspirin-Salicylamide-Caffeine (BC HEADACHE POWDER PO) Take 1 packet by mouth as needed (migraines). PT states she takes PRN for migraines   Yes Historical Provider, MD  ibuprofen (ADVIL,MOTRIN) 200 MG tablet Take 800 mg by mouth every 6 (six) hours as needed (menstrual cramps). For migraine   Yes Historical Provider, MD  polyethylene glycol (MIRALAX / GLYCOLAX) packet Take 17 g by mouth daily as needed for mild constipation (constipation).   Yes Historical Provider, MD  omeprazole (PRILOSEC) 20 MG capsule Take 1 capsule (20 mg total) by mouth daily. 01/23/14   Junius FinnerErin O'Malley, PA-C  ondansetron (ZOFRAN ODT) 8 MG disintegrating tablet Take 1 tablet (8 mg total) by mouth every 8 (eight) hours as needed for nausea or vomiting. 03/25/13   Carlisle BeersJohn L Molpus, MD   BP 119/73 mmHg  Pulse 82  Temp(Src) 98.1 F (36.7 C) (Oral)  Resp 17  SpO2 99%  LMP 01/06/2014 Physical Exam  Constitutional: She appears well-developed and well-nourished. No distress.  HENT:  Head: Normocephalic and atraumatic.  Right Ear: Hearing, tympanic membrane, external ear and ear canal normal.  Left Ear: Hearing, tympanic membrane, external ear and ear canal normal.  Nose: Nose normal.  Mouth/Throat: Uvula is midline, oropharynx is clear and moist and mucous membranes are normal.  Eyes: Conjunctivae  are normal. No scleral icterus.  Neck: Normal range of motion. Neck supple.  Cardiovascular: Normal rate, regular rhythm and normal heart sounds.   Pulmonary/Chest: Effort normal and breath sounds normal. No respiratory distress. She has no wheezes. She has no rales. She exhibits no tenderness.  Abdominal: Soft. Bowel sounds are normal. She exhibits no distension and no mass. There is no tenderness. There is no rebound and no  guarding.  Musculoskeletal: Normal range of motion.  Neurological: She is alert.  Skin: Skin is warm and dry. She is not diaphoretic.  Nursing note and vitals reviewed.   ED Course  Procedures (including critical care time) Labs Review Labs Reviewed  BASIC METABOLIC PANEL - Abnormal; Notable for the following:    Creatinine, Ser 1.21 (*)    GFR calc non Af Amer 59 (*)    GFR calc Af Amer 68 (*)    All other components within normal limits  CBC - Abnormal; Notable for the following:    Hemoglobin 11.8 (*)    All other components within normal limits  I-STAT TROPOININ, ED    Imaging Review Dg Chest 2 View  01/23/2014   CLINICAL DATA:  Chest pain.  Chest tightness for 2-3 days.  EXAM: CHEST  2 VIEW  COMPARISON:  03/01/2011  FINDINGS: The heart size and mediastinal contours are within normal limits. Both lungs are clear. The visualized skeletal structures are unremarkable.  IMPRESSION: No active cardiopulmonary disease.   Electronically Signed   By: Herbie Baltimore M.D.   On: 01/23/2014 17:38      EKG Interpretation   Date/Time:  Sunday January 23 2014 16:17:20 EST Ventricular Rate:  83 PR Interval:  141 QRS Duration: 88 QT Interval:  336 QTC Calculation: 395 R Axis:   60 Text Interpretation:  Sinus rhythm Low voltage, precordial leads  Borderline T abnormalities, anterior leads Confirmed by Fayrene Fearing  MD, MARK  [phone]) on 01/23/2014 11:58:46 PM      MDM   Final diagnoses:  Other chest pain  Gastroesophageal reflux disease, esophagitis presence not specified  History of hiatal hernia    Pt is a 33yo female c/o epigastric pain and chest pain radiating into her back and throat.  Hx of GERD, non-compliant with medications. Pt is low risk for ACS based on HEART score. PERC negative. No hx of asthma. Lungs; CTAB. CXR: unremarkable. EKG: unremarkable. istat troponin: did not cross over into results, visualized result of 0.00, negative for elevation.  Not concerned for emergent  process taking place at this time. Doubt aortic dissection, ACS, or PE.   Will tx pt for GERD. Advised to take her medications as prescribed. Pt is hemodynamically stable for discharge home.  Home care instructions provided. Advised to f/u with GI specialist for continued tx of her symptoms. Also encouraged pt to established care with a PCP for ongoing healthcare needs. Return precautions provided. Pt verbalized understanding and agreement with tx plan.   Junius Finner, PA-C 01/24/14 0028  Rolland Porter, MD 02/01/14 [phone]

## 2014-01-24 LAB — I-STAT TROPONIN, ED: Troponin i, poc: 0 ng/mL (ref 0.00–0.08)

## 2014-04-15 ENCOUNTER — Encounter (HOSPITAL_COMMUNITY): Payer: Self-pay | Admitting: Emergency Medicine

## 2014-04-15 ENCOUNTER — Emergency Department (HOSPITAL_COMMUNITY)
Admission: EM | Admit: 2014-04-15 | Discharge: 2014-04-15 | Disposition: A | Discharge status: Home / Self Care | Payer: Self-pay | Source: Home / Self Care | Attending: Emergency Medicine | Admitting: Emergency Medicine

## 2014-04-15 DIAGNOSIS — R3989 Other symptoms and signs involving the genitourinary system: Secondary | ICD-10-CM

## 2014-04-15 LAB — POCT I-STAT, CHEM 8
BUN: 11 mg/dL (ref 6–23)
CALCIUM ION: 1.18 mmol/L (ref 1.12–1.23)
Chloride: 102 mmol/L (ref 96–112)
Creatinine, Ser: 0.8 mg/dL (ref 0.50–1.10)
Glucose, Bld: 126 mg/dL — ABNORMAL HIGH (ref 70–99)
HCT: 37 % (ref 36.0–46.0)
Hemoglobin: 12.6 g/dL (ref 12.0–15.0)
POTASSIUM: 3.2 mmol/L — AB (ref 3.5–5.1)
Sodium: 138 mmol/L (ref 135–145)
TCO2: 20 mmol/L (ref 0–100)

## 2014-04-15 LAB — POCT URINALYSIS DIP (DEVICE)
Bilirubin Urine: NEGATIVE
Glucose, UA: NEGATIVE mg/dL
Hgb urine dipstick: NEGATIVE
KETONES UR: NEGATIVE mg/dL
LEUKOCYTES UA: NEGATIVE
Nitrite: NEGATIVE
PROTEIN: NEGATIVE mg/dL
Specific Gravity, Urine: >=1.03 (ref 1.005–1.030)
UROBILINOGEN UA: 0.2 mg/dL (ref 0.0–1.0)
pH: 5.5 (ref 5.0–8.0)

## 2014-04-15 LAB — POCT PREGNANCY, URINE: PREG TEST UR: NEGATIVE

## 2014-04-15 MED ORDER — TRAMADOL HCL 50 MG PO TABS: 50.0000 mg | ORAL_TABLET | Freq: Four times a day (QID) | ORAL | Status: AC | PRN

## 2014-04-15 MED ORDER — CIPROFLOXACIN HCL 500 MG PO TABS: 500.0000 mg | ORAL_TABLET | Freq: Two times a day (BID) | ORAL | Status: AC

## 2014-04-15 NOTE — ED Provider Notes (Signed)
CSN: [id number]     Arrival date & time 04/15/14  0802 History   First MD Initiated Contact with Patient 04/15/14 [phone]     Chief Complaint  Patient presents with  . Back Pain  . Abdominal Pain   (Consider location/radiation/quality/duration/timing/severity/associated sxs/prior Treatment) HPI  She is a 34 year old woman here for evaluation of low back and abdominal pain. She states for the last 7-10 days she has had "urethra" pain and pain in her right lower back that radiates around to her right lower quadrant.  She feels a similar pain on the left, but the right is much worse. The pain is constant. Nothing makes it better or worse. She has not tried any medications. She states this is how she feels when she has a urinary tract infection. She reports a history of a cyst on her right kidney and since then she has had multiple UTIs. She denies any dysuria, hematuria, frequency, urgency. No vaginal discharge or vaginal discomfort. No new sexual partners.  Past Medical History  Diagnosis Date  . Persistent headaches   . Anxiety   . Depression   . Migraine    History reviewed. No pertinent past surgical history. Family History  Problem Relation Age of Onset  . Hypertension Mother   . Heart disease Mother   . Hypertension Maternal Grandmother   . Diabetes Maternal Grandmother   . Cancer Paternal Grandmother   . Heart disease Paternal Grandfather   . Hypertension Father   . Depression Brother   . Insomnia Mother   . Insomnia Father    History  Substance Use Topics  . Smoking status: Never Smoker   . Smokeless tobacco: Never Used  . Alcohol Use: 1.1 oz/week    1 Glasses of wine, 1 Standard drinks or equivalent per week   OB History    No data available     Review of Systems  Constitutional: Negative for fever.  HENT: Negative.   Respiratory: Negative.   Cardiovascular: Negative.   Gastrointestinal: Positive for abdominal pain. Negative for nausea and vomiting.  Genitourinary:  Negative for dysuria, urgency, frequency, hematuria, flank pain, vaginal discharge and difficulty urinating.  Musculoskeletal: Positive for back pain.    Allergies  Review of patient's allergies indicates no known allergies.  Home Medications   Prior to Admission medications   Medication Sig Start Date End Date Taking? Authorizing Provider  ibuprofen (ADVIL,MOTRIN) 200 MG tablet Take 800 mg by mouth every 6 (six) hours as needed (menstrual cramps). For migraine   Yes Historical Provider, MD  Aspirin-Salicylamide-Caffeine (BC HEADACHE POWDER PO) Take 1 packet by mouth as needed (migraines). PT states she takes PRN for migraines    Historical Provider, MD  ciprofloxacin (CIPRO) 500 MG tablet Take 1 tablet (500 mg total) by mouth 2 (two) times daily. 04/15/14   Charm Rings, MD  omeprazole (PRILOSEC) 20 MG capsule Take 1 capsule (20 mg total) by mouth daily. 01/23/14   Junius Finner, PA-C  ondansetron (ZOFRAN ODT) 8 MG disintegrating tablet Take 1 tablet (8 mg total) by mouth every 8 (eight) hours as needed for nausea or vomiting. 03/25/13   Paula Libra, MD  polyethylene glycol (MIRALAX / GLYCOLAX) packet Take 17 g by mouth daily as needed for mild constipation (constipation).    Historical Provider, MD  traMADol (ULTRAM) 50 MG tablet Take 1 tablet (50 mg total) by mouth every 6 (six) hours as needed. 04/15/14   Charm Rings, MD   BP 112/65 mmHg  Pulse  88  Temp(Src) 99 F (37.2 C) (Oral)  Resp 16  SpO2 99%  LMP 04/14/2014 Physical Exam  Constitutional: She is oriented to person, place, and time. She appears well-developed and well-nourished. No distress.  Neck: Neck supple.  Cardiovascular: Normal rate, regular rhythm and normal heart sounds.   No murmur heard. Pulmonary/Chest: Effort normal.  Abdominal: Soft. Bowel sounds are normal. She exhibits no distension. There is no tenderness. There is no rebound and no guarding.    Musculoskeletal:       Back:  Back: No erythema or edema. No  vertebral tenderness or step-offs. No CVA tenderness. No specific point tenderness.  Neurological: She is alert and oriented to person, place, and time.    ED Course  Procedures (including critical care time) Labs Review Labs Reviewed  POCT I-STAT, CHEM 8 - Abnormal; Notable for the following:    Potassium 3.2 (*)    Glucose, Bld 126 (*)    All other components within normal limits  URINE CULTURE  POCT URINALYSIS DIP (DEVICE)  POCT PREGNANCY, URINE    Imaging Review No results found.   MDM   1. Urethral pain    She also has right lower abdominal and pelvic pain. As she states these are the same symptoms she had with prior urinary tract infections, will go ahead and treat with Cipro. Her urine has been sent for culture. I think there is a strong possibility that she has an ovarian cyst. Ibuprofen and tramadol for pain. [facility].    Charm RingsErin J Stellan Vick, MD 04/15/14 [phone]

## 2014-04-15 NOTE — ED Notes (Signed)
Pt c/o lower back pain and lower abd pain onset 1 week Concerned about UTI Denies urinary freq/urgency, hematuria, dysuria Alert, no signs of acute distress.

## 2014-04-15 NOTE — Discharge Instructions (Signed)
We are going to treat you for a urinary tract infection. Take Cipro twice a day for one week. You can take ibuprofen 600 mg up to 4 times a day for pain. Use the tramadol every 6 hours as needed for severe discomfort. I think it would be beneficial for you to see the urologist as well as [facility]. Follow-up if your symptoms do not improve or worsen.

## 2014-04-16 LAB — URINE CULTURE: Colony Count: >=100000

## 2014-05-20 ENCOUNTER — Emergency Department (HOSPITAL_COMMUNITY)
Admission: EM | Admit: 2014-05-20 | Discharge: 2014-05-20 | Disposition: A | Discharge status: Home / Self Care | Payer: Self-pay | Source: Home / Self Care | Attending: Family Medicine | Admitting: Family Medicine

## 2014-05-20 ENCOUNTER — Encounter (HOSPITAL_COMMUNITY): Payer: Self-pay | Admitting: *Deleted

## 2014-05-20 DIAGNOSIS — W57XXXA Bitten or stung by nonvenomous insect and other nonvenomous arthropods, initial encounter: Secondary | ICD-10-CM

## 2014-05-20 DIAGNOSIS — T148 Other injury of unspecified body region: Secondary | ICD-10-CM

## 2014-05-20 HISTORY — DX: Pure hypercholesterolemia, unspecified: E78.00

## 2014-05-20 MED ORDER — DOXYCYCLINE HYCLATE 100 MG PO CAPS: 100.0000 mg | ORAL_CAPSULE | Freq: Two times a day (BID) | ORAL | Status: DC

## 2014-05-20 NOTE — ED Notes (Signed)
Pt is here with 3 possible insect bites to legs. Areas are red and swollen. Pt recently traveled.

## 2014-05-20 NOTE — ED Provider Notes (Signed)
CSN: [id number]     Arrival date & time 05/20/14  1658 History   None    Chief Complaint  Patient presents with  . Insect Bite   (Consider location/radiation/quality/duration/timing/severity/associated sxs/prior Treatment) Patient is a 34 y.o. female presenting with rash. The history is provided by the patient.  Rash Location:  Leg Leg rash location:  L knee and L upper leg Quality: painful and redness   Pain details:    Quality:  Sore and throbbing   Severity:  Mild   Onset quality:  Sudden   Duration:  3 days   Progression:  Worsening Severity:  Mild Chronicity:  New Context: insect bite/sting   Context comment:  Lesions on left leg linear orientation medial. Relieved by:  None tried Worsened by:  Nothing tried Ineffective treatments:  None tried Associated symptoms: no fever     Past Medical History  Diagnosis Date  . Persistent headaches   . Anxiety   . Depression   . Migraine   . High cholesterol    History reviewed. No pertinent past surgical history. Family History  Problem Relation Age of Onset  . Hypertension Mother   . Heart disease Mother   . Hypertension Maternal Grandmother   . Diabetes Maternal Grandmother   . Cancer Paternal Grandmother   . Heart disease Paternal Grandfather   . Hypertension Father   . Depression Brother   . Insomnia Mother   . Insomnia Father    History  Substance Use Topics  . Smoking status: Never Smoker   . Smokeless tobacco: Never Used  . Alcohol Use: 1.1 oz/week    1 Glasses of wine, 1 Standard drinks or equivalent per week   OB History    No data available     Review of Systems  Constitutional: Negative for fever.  Skin: Positive for rash.    Allergies  Review of patient's allergies indicates no known allergies.  Home Medications   Prior to Admission medications   Medication Sig Start Date End Date Taking? Authorizing Provider  Aspirin-Salicylamide-Caffeine (BC HEADACHE POWDER PO) Take 1 packet by mouth as  needed (migraines). PT states she takes PRN for migraines    Historical Provider, MD  ciprofloxacin (CIPRO) 500 MG tablet Take 1 tablet (500 mg total) by mouth 2 (two) times daily. 04/15/14   Charm RingsErin J Honig, MD  doxycycline (VIBRAMYCIN) 100 MG capsule Take 1 capsule (100 mg total) by mouth 2 (two) times daily. 05/20/14   Linna HoffJames D Hulet Ehrmann, MD  ibuprofen (ADVIL,MOTRIN) 200 MG tablet Take 800 mg by mouth every 6 (six) hours as needed (menstrual cramps). For migraine    Historical Provider, MD  omeprazole (PRILOSEC) 20 MG capsule Take 1 capsule (20 mg total) by mouth daily. 01/23/14   Junius FinnerErin O'Malley, PA-C  ondansetron (ZOFRAN ODT) 8 MG disintegrating tablet Take 1 tablet (8 mg total) by mouth every 8 (eight) hours as needed for nausea or vomiting. 03/25/13   Paula LibraJohn Molpus, MD  polyethylene glycol (MIRALAX / GLYCOLAX) packet Take 17 g by mouth daily as needed for mild constipation (constipation).    Historical Provider, MD  traMADol (ULTRAM) 50 MG tablet Take 1 tablet (50 mg total) by mouth every 6 (six) hours as needed. 04/15/14   Charm RingsErin J Honig, MD   BP 121/57 mmHg  Pulse 83  Temp(Src) 98.8 F (37.1 C) (Oral)  SpO2 98%  LMP 05/08/2014 Physical Exam  Constitutional: She is oriented to person, place, and time. She appears well-developed and well-nourished.  Neurological:  She is alert and oriented to person, place, and time.  Skin: Skin is warm and dry. There is erythema.     Nursing note and vitals reviewed.   ED Course  Procedures (including critical care time) Labs Review Labs Reviewed - No data to display  Imaging Review No results found.   MDM   1. Multiple insect bites        Linna HoffJames D Iolani Twilley, MD 05/20/14 [phone]

## 2014-05-20 NOTE — Discharge Instructions (Signed)
Scrub as directed, take all of medicine, return if needed.

## 2014-06-13 ENCOUNTER — Emergency Department (HOSPITAL_COMMUNITY)
Admission: EM | Admit: 2014-06-13 | Discharge: 2014-06-13 | Disposition: A | Discharge status: Home / Self Care | Payer: Self-pay | Attending: Emergency Medicine | Admitting: Emergency Medicine

## 2014-06-13 ENCOUNTER — Encounter (HOSPITAL_COMMUNITY): Payer: Self-pay

## 2014-06-13 ENCOUNTER — Emergency Department (HOSPITAL_COMMUNITY): Payer: Self-pay

## 2014-06-13 DIAGNOSIS — Z3202 Encounter for pregnancy test, result negative: Secondary | ICD-10-CM | POA: Insufficient documentation

## 2014-06-13 DIAGNOSIS — F329 Major depressive disorder, single episode, unspecified: Secondary | ICD-10-CM | POA: Insufficient documentation

## 2014-06-13 DIAGNOSIS — R05 Cough: Secondary | ICD-10-CM | POA: Insufficient documentation

## 2014-06-13 DIAGNOSIS — R079 Chest pain, unspecified: Secondary | ICD-10-CM | POA: Insufficient documentation

## 2014-06-13 DIAGNOSIS — Z8679 Personal history of other diseases of the circulatory system: Secondary | ICD-10-CM | POA: Insufficient documentation

## 2014-06-13 DIAGNOSIS — Z79899 Other long term (current) drug therapy: Secondary | ICD-10-CM | POA: Insufficient documentation

## 2014-06-13 DIAGNOSIS — Z8639 Personal history of other endocrine, nutritional and metabolic disease: Secondary | ICD-10-CM | POA: Insufficient documentation

## 2014-06-13 DIAGNOSIS — F419 Anxiety disorder, unspecified: Secondary | ICD-10-CM | POA: Insufficient documentation

## 2014-06-13 DIAGNOSIS — Z792 Long term (current) use of antibiotics: Secondary | ICD-10-CM | POA: Insufficient documentation

## 2014-06-13 DIAGNOSIS — M549 Dorsalgia, unspecified: Secondary | ICD-10-CM | POA: Insufficient documentation

## 2014-06-13 LAB — COMPREHENSIVE METABOLIC PANEL
ALT: 12 U/L — ABNORMAL LOW (ref 14–54)
AST: 19 U/L (ref 15–41)
Albumin: 4.2 g/dL (ref 3.5–5.0)
Alkaline Phosphatase: 71 U/L (ref 38–126)
Anion gap: 8 (ref 5–15)
BUN: 20 mg/dL (ref 6–20)
CALCIUM: 8.7 mg/dL — AB (ref 8.9–10.3)
CHLORIDE: 105 mmol/L (ref 101–111)
CO2: 21 mmol/L — ABNORMAL LOW (ref 22–32)
Creatinine, Ser: 0.93 mg/dL (ref 0.44–1.00)
Glucose, Bld: 95 mg/dL (ref 65–99)
POTASSIUM: 3.5 mmol/L (ref 3.5–5.1)
SODIUM: 134 mmol/L — AB (ref 135–145)
Total Bilirubin: 0.4 mg/dL (ref 0.3–1.2)
Total Protein: 7.9 g/dL (ref 6.5–8.1)

## 2014-06-13 LAB — CBC WITH DIFFERENTIAL/PLATELET
BASOS ABS: 0 10*3/uL (ref 0.0–0.1)
Basophils Relative: 0 % (ref 0–1)
EOS PCT: 3 % (ref 0–5)
Eosinophils Absolute: 0.2 10*3/uL (ref 0.0–0.7)
HCT: 37 % (ref 36.0–46.0)
Hemoglobin: 12 g/dL (ref 12.0–15.0)
LYMPHS ABS: 1.4 10*3/uL (ref 0.7–4.0)
Lymphocytes Relative: 23 % (ref 12–46)
MCH: 27.8 pg (ref 26.0–34.0)
MCHC: 32.4 g/dL (ref 30.0–36.0)
MCV: 85.6 fL (ref 78.0–100.0)
Monocytes Absolute: 0.7 10*3/uL (ref 0.1–1.0)
Monocytes Relative: 12 % (ref 3–12)
Neutro Abs: 3.9 10*3/uL (ref 1.7–7.7)
Neutrophils Relative %: 62 % (ref 43–77)
Platelets: 262 10*3/uL (ref 150–400)
RBC: 4.32 MIL/uL (ref 3.87–5.11)
RDW: 13.8 % (ref 11.5–15.5)
WBC: 6.2 10*3/uL (ref 4.0–10.5)

## 2014-06-13 LAB — URINALYSIS, ROUTINE W REFLEX MICROSCOPIC
BILIRUBIN URINE: NEGATIVE
Glucose, UA: NEGATIVE mg/dL
HGB URINE DIPSTICK: NEGATIVE
KETONES UR: NEGATIVE mg/dL
Leukocytes, UA: NEGATIVE
Nitrite: NEGATIVE
PH: 6 (ref 5.0–8.0)
PROTEIN: NEGATIVE mg/dL
SPECIFIC GRAVITY, URINE: 1.014 (ref 1.005–1.030)
UROBILINOGEN UA: 0.2 mg/dL (ref 0.0–1.0)

## 2014-06-13 LAB — LIPASE, BLOOD: LIPASE: 21 U/L — AB (ref 22–51)

## 2014-06-13 LAB — POC URINE PREG, ED: Preg Test, Ur: NEGATIVE

## 2014-06-13 LAB — D-DIMER, QUANTITATIVE (NOT AT ARMC)

## 2014-06-13 LAB — I-STAT TROPONIN, ED: Troponin i, poc: 0 ng/mL (ref 0.00–0.08)

## 2014-06-13 MED ORDER — GI COCKTAIL ~~LOC~~
30.0000 mL | Freq: Once | ORAL | Status: AC
  Administered 2014-06-13: 30 mL via ORAL
  Filled 2014-06-13: qty 30

## 2014-06-13 MED ORDER — KETOROLAC TROMETHAMINE 30 MG/ML IJ SOLN
30.0000 mg | Freq: Once | INTRAMUSCULAR | Status: AC
  Administered 2014-06-13: 30 mg via INTRAVENOUS
  Filled 2014-06-13: qty 1

## 2014-06-13 NOTE — Discharge Instructions (Signed)

## 2014-06-13 NOTE — ED Provider Notes (Signed)
CSN: [id number]     Arrival date & time 06/13/14  0805 History   First MD Initiated Contact with Patient 06/13/14 [phone]     Chief Complaint  Patient presents with  . Chest Pain  . Back Pain     (Consider location/radiation/quality/duration/timing/severity/associated sxs/prior Treatment) Patient is a 34 y.o. female presenting with chest pain.  Chest Pain Pain location:  R chest Pain quality: sharp   Pain radiates to:  Does not radiate Pain radiates to the back: yes   Pain severity:  Moderate Onset quality:  Sudden Duration:  3 hours Timing:  Constant Progression:  Waxing and waning Chronicity:  New Context: at rest   Context comment:  Coughing x 3 weeks Relieved by:  Nothing Worsened by:  Coughing and deep breathing Associated symptoms: no abdominal pain, no nausea and not vomiting     Past Medical History  Diagnosis Date  . Persistent headaches   . Anxiety   . Depression   . Migraine   . High cholesterol    History reviewed. No pertinent past surgical history. Family History  Problem Relation Age of Onset  . Hypertension Mother   . Heart disease Mother   . Hypertension Maternal Grandmother   . Diabetes Maternal Grandmother   . Cancer Paternal Grandmother   . Heart disease Paternal Grandfather   . Hypertension Father   . Depression Brother   . Insomnia Mother   . Insomnia Father    History  Substance Use Topics  . Smoking status: Never Smoker   . Smokeless tobacco: Never Used  . Alcohol Use: 1.1 oz/week    1 Glasses of wine, 1 Standard drinks or equivalent per week   OB History    No data available     Review of Systems  Cardiovascular: Positive for chest pain.  Gastrointestinal: Negative for nausea, vomiting and abdominal pain.  All other systems reviewed and are negative.     Allergies  Milk-related compounds  Home Medications   Prior to Admission medications   Medication Sig Start Date End Date Taking? Authorizing Provider   Aspirin-Salicylamide-Caffeine (BC HEADACHE POWDER PO) Take 1 packet by mouth every 6 (six) hours as needed (For migraines.).    Yes Historical Provider, MD  ibuprofen (ADVIL,MOTRIN) 200 MG tablet Take 800 mg by mouth every 6 (six) hours as needed (For migraines or menstrual cramps.).    Yes Historical Provider, MD  polyethylene glycol (MIRALAX / GLYCOLAX) packet Take 17 g by mouth daily as needed (For constipation.).    Yes Historical Provider, MD  ciprofloxacin (CIPRO) 500 MG tablet Take 1 tablet (500 mg total) by mouth 2 (two) times daily. Patient not taking: Reported on 06/13/2014 04/15/14   Charm RingsErin J Honig, MD  doxycycline (VIBRAMYCIN) 100 MG capsule Take 1 capsule (100 mg total) by mouth 2 (two) times daily. Patient not taking: Reported on 06/13/2014 05/20/14   Linna HoffJames D Kindl, MD  omeprazole (PRILOSEC) 20 MG capsule Take 1 capsule (20 mg total) by mouth daily. Patient not taking: Reported on 06/13/2014 01/23/14   Junius FinnerErin O'Malley, PA-C  ondansetron (ZOFRAN ODT) 8 MG disintegrating tablet Take 1 tablet (8 mg total) by mouth every 8 (eight) hours as needed for nausea or vomiting. Patient not taking: Reported on 06/13/2014 03/25/13   Paula LibraJohn Molpus, MD  traMADol (ULTRAM) 50 MG tablet Take 1 tablet (50 mg total) by mouth every 6 (six) hours as needed. Patient not taking: Reported on 06/13/2014 04/15/14   Charm RingsErin J Honig, MD   BP  122/68 mmHg  Pulse 89  Temp(Src) 97.6 F (36.4 C) (Oral)  Resp 20  SpO2 99%  LMP 06/06/2014 Physical Exam  Constitutional: She is oriented to person, place, and time. She appears well-developed and well-nourished.  HENT:  Head: Normocephalic and atraumatic.  Right Ear: External ear normal.  Left Ear: External ear normal.  Eyes: Conjunctivae and EOM are normal. Pupils are equal, round, and reactive to light.  Neck: Normal range of motion. Neck supple.  Cardiovascular: Normal rate, regular rhythm, normal heart sounds and intact distal pulses.   Pulmonary/Chest: Effort normal and breath  sounds normal.  Abdominal: Soft. Bowel sounds are normal. There is no tenderness.  Musculoskeletal: Normal range of motion.  Neurological: She is alert and oriented to person, place, and time.  Skin: Skin is warm and dry.  Vitals reviewed.   ED Course  Procedures (including critical care time) Labs Review Labs Reviewed  COMPREHENSIVE METABOLIC PANEL - Abnormal; Notable for the following:    Sodium 134 (*)    CO2 21 (*)    Calcium 8.7 (*)    ALT 12 (*)    All other components within normal limits  LIPASE, BLOOD - Abnormal; Notable for the following:    Lipase 21 (*)    All other components within normal limits  URINALYSIS, ROUTINE W REFLEX MICROSCOPIC (NOT AT [facility]) - Abnormal; Notable for the following:    APPearance CLOUDY (*)    All other components within normal limits  CBC WITH DIFFERENTIAL/PLATELET  D-DIMER, QUANTITATIVE (NOT AT [facility])  POC URINE PREG, ED  I-STAT TROPOININ, ED    Imaging Review Dg Chest 2 View  06/13/2014   CLINICAL DATA:  Severe central chest pain and back pain since earlier this morning.  EXAM: CHEST  2 VIEW  COMPARISON:  01/23/2014  FINDINGS: The heart size and mediastinal contours are within normal limits. Both lungs are clear. The visualized skeletal structures are unremarkable.  IMPRESSION: No active cardiopulmonary disease.   Electronically Signed   By: Judie Petit.  Shick M.D.   On: 06/13/2014 08:54     EKG Interpretation   Date/Time:  Monday Jun 13 2014 08:15:11 EDT Ventricular Rate:  75 PR Interval:  155 QRS Duration: 89 QT Interval:  380 QTC Calculation: 424 R Axis:   61 Text Interpretation:  Sinus rhythm RSR' in V1 or V2, probably normal  variant Borderline T abnormalities, anterior leads Baseline wander in  lead(s) V6 No significant change since last tracing Confirmed by Mirian Mo [phone]) on 06/13/2014 8:19:16 AM      MDM   Final diagnoses:  Chest pain, unspecified chest pain type    34 y.o. female with pertinent PMH of anxiety,  depression, migraine presents with chest pain as above.  Physical exam benign.  Wu negative.  Pain free throughout my exam.  DC home to fu with cardiology.  I have reviewed all laboratory and imaging studies if ordered as above  1. Chest pain, unspecified chest pain type          Mirian Mo, MD 06/14/14 1714

## 2014-06-13 NOTE — ED Notes (Signed)
Pt c/o central chest pain penetrating into upper back, headache, and nausea starting this morning.  Pain score 7/10.  Pt reports pain increases w/ deep breathing.  Sts "I've been coughing a lot lately."  Hx of high cholesterol.

## 2015-07-05 IMAGING — US US ABDOMEN COMPLETE
1 series · 14 of 25 positions shown · non-contrast
Comparison: US RENAL dated 02/23/2008; CT ABD W/CM dated 02/23/2008

CLINICAL DATA: Epigastric abdominal pain.

EXAM:
ULTRASOUND ABDOMEN COMPLETE

[Series 1: us abdomen complete · 0.24mm/px · 14 of 75 slices shown]
[im 1/75]
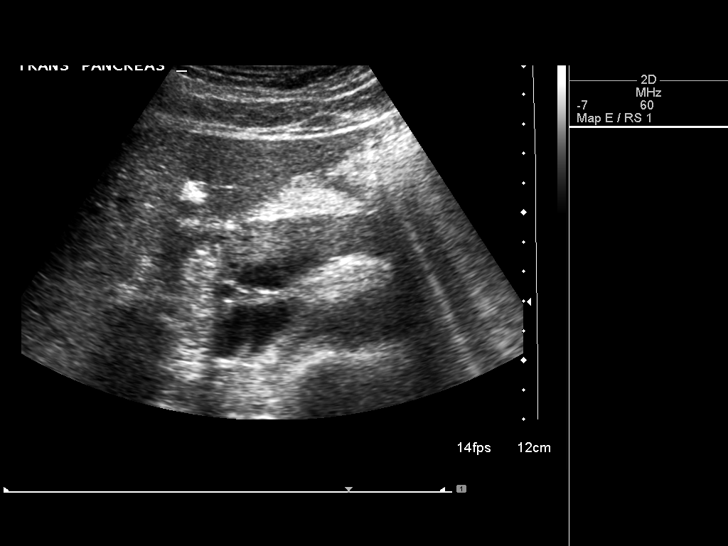
[im 7/75]
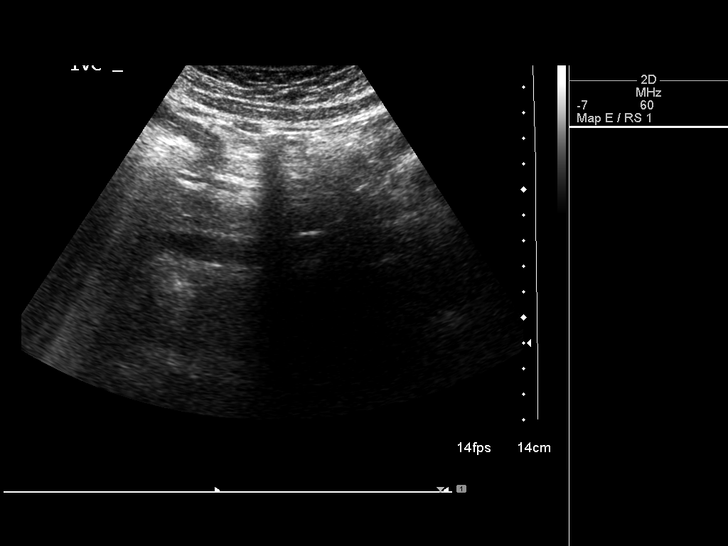
[im 13/75]
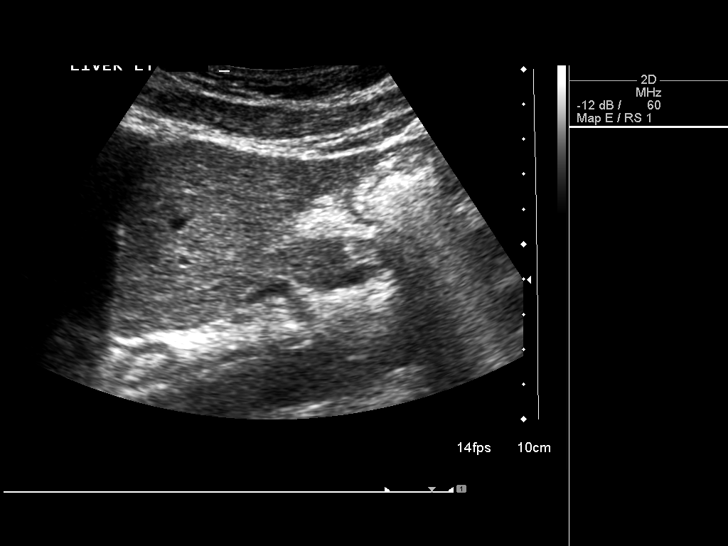
[im 19/75]
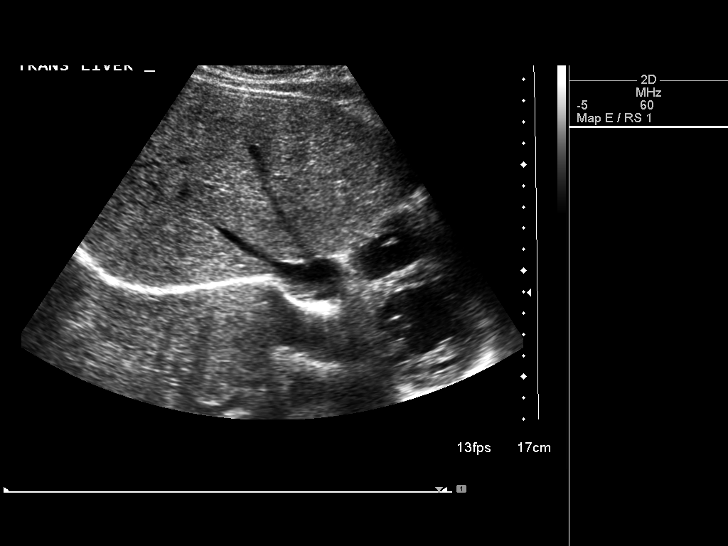
[im 25/75]
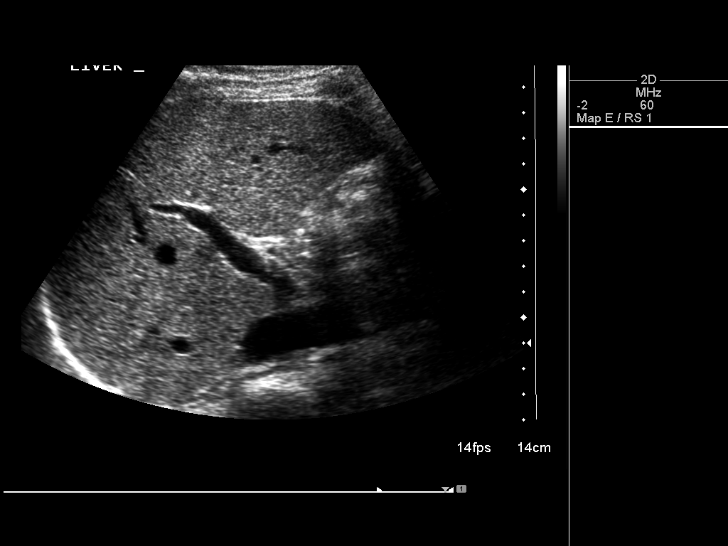
[im 28/75]
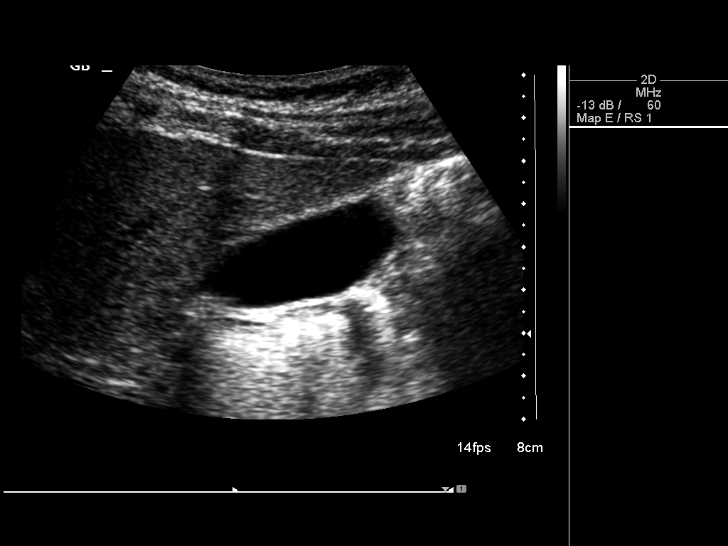
[im 34/75]
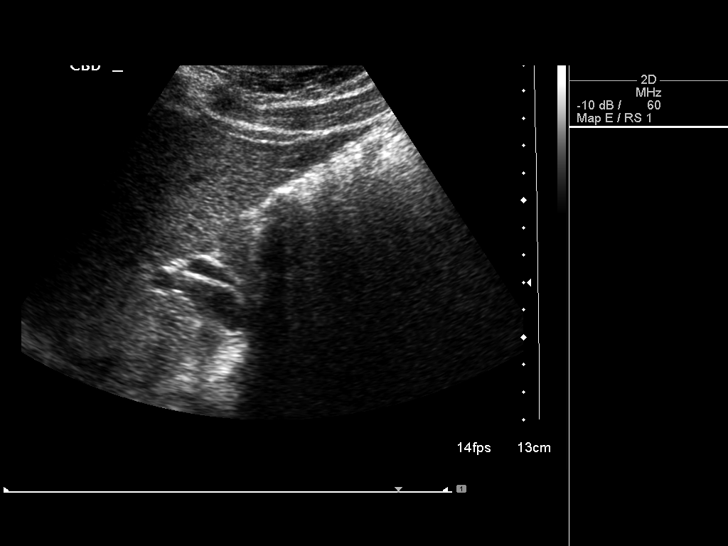
[im 41/75]
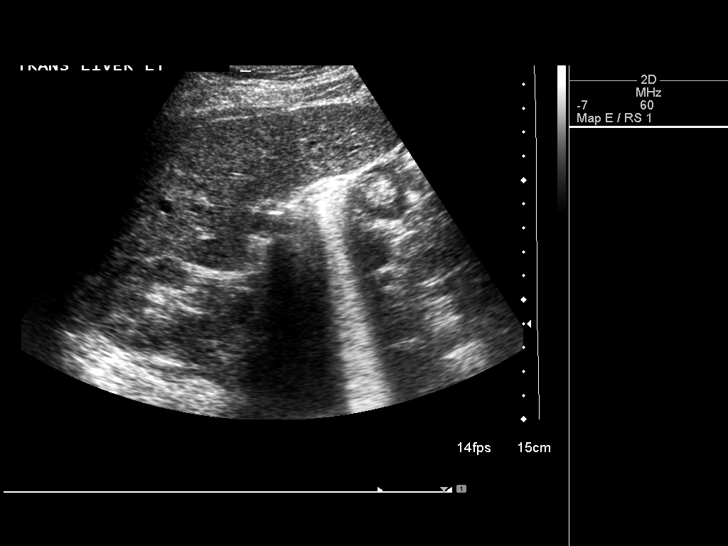
[im 47/75]
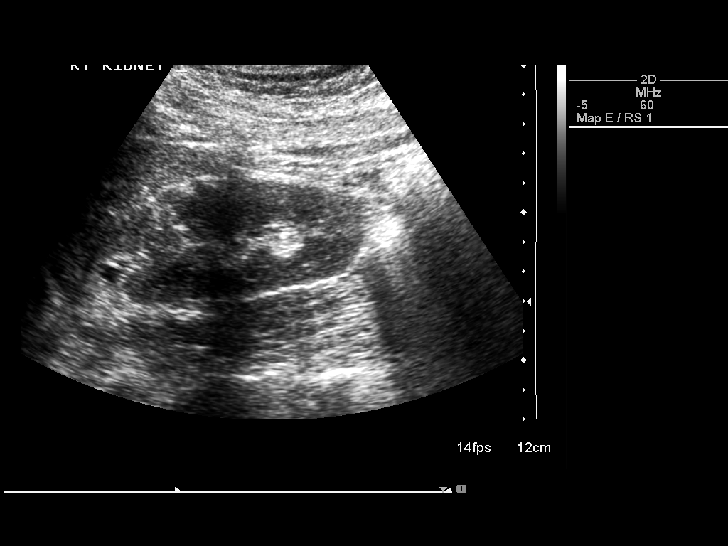
[im 50/75]
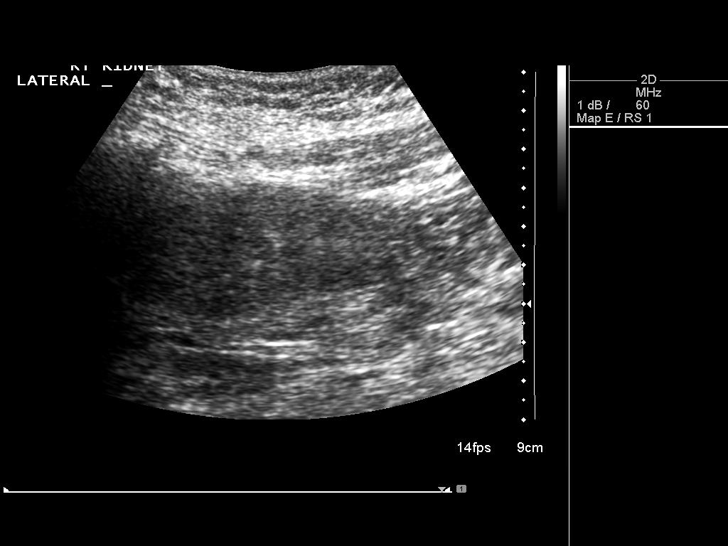
[im 56/75]
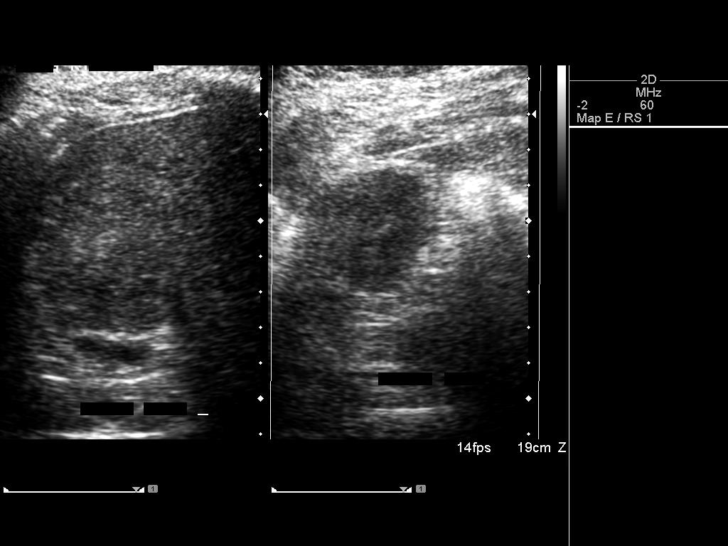
[im 62/75]
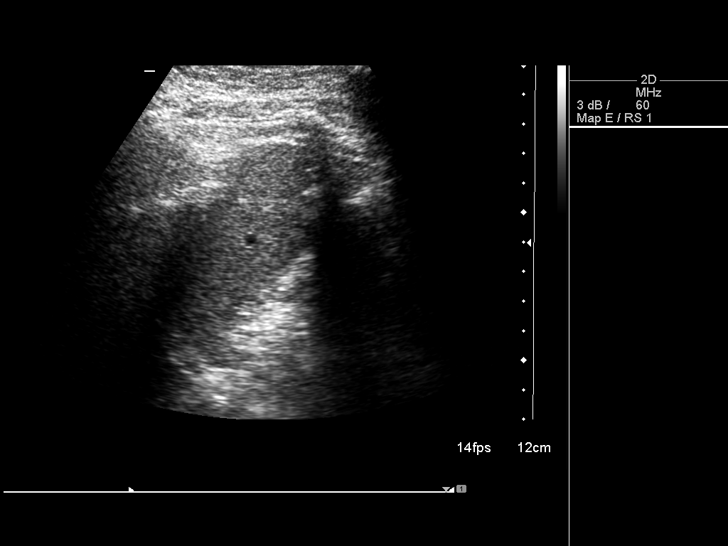
[im 68/75]
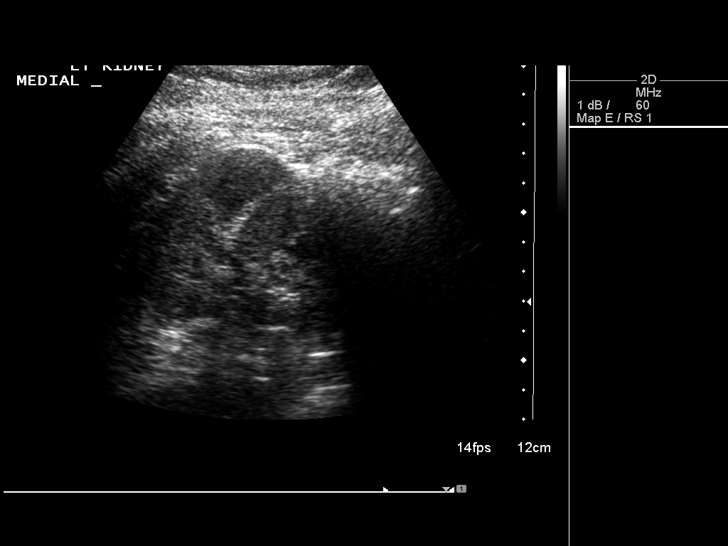
[im 75/75]
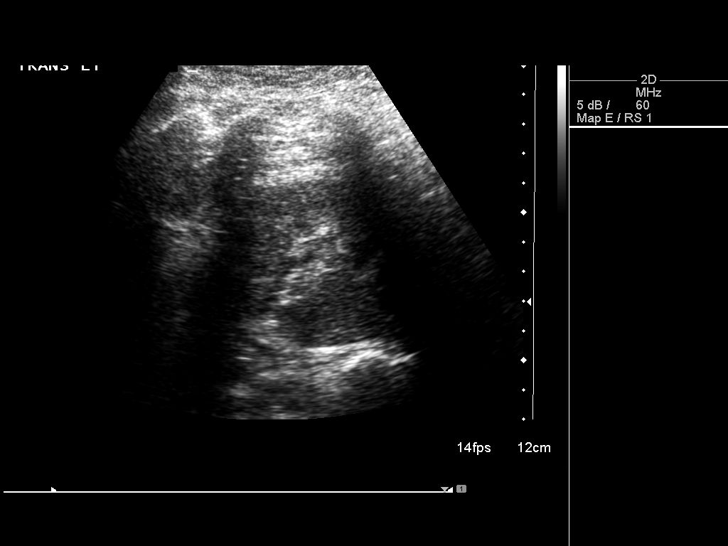

[14 of 25 positions shown; findings below may reference images not displayed]

FINDINGS: Gallbladder:

No gallstones or wall thickening visualized. No sonographic Murphy
sign noted.

Common bile duct:

Diameter: Normal, 5 mm.

Liver:

No focal lesion identified. Within normal limits in parenchymal
echogenicity.

IVC:

No abnormality visualized.

Pancreas:

Visualized portion unremarkable.

Spleen:

Size and appearance within normal limits.

Right Kidney:

Length: 9.7 cm.. Mild scarring in the upper pole right kidney. No
hydronephrosis.

Left Kidney:

Length: 10.0 cm.. Echogenicity within normal limits. No mass or
hydronephrosis visualized.

Abdominal aorta:

No aneurysm visualized.

Other findings:

None.
IMPRESSION: 1.  No acute process or explanation for epigastric pain.
2. Upper pole right renal scarring.

## 2016-03-13 ENCOUNTER — Ambulatory Visit (INDEPENDENT_AMBULATORY_CARE_PROVIDER_SITE_OTHER): Payer: Self-pay

## 2016-03-13 ENCOUNTER — Ambulatory Visit (HOSPITAL_COMMUNITY)
Admission: EM | Admit: 2016-03-13 | Discharge: 2016-03-13 | Disposition: A | Discharge status: Home / Self Care | Payer: Self-pay | Attending: Family Medicine | Admitting: Family Medicine

## 2016-03-13 DIAGNOSIS — J0100 Acute maxillary sinusitis, unspecified: Secondary | ICD-10-CM

## 2016-03-13 MED ORDER — DOXYCYCLINE HYCLATE 100 MG PO CAPS: 100.0000 mg | ORAL_CAPSULE | Freq: Two times a day (BID) | ORAL | 0 refills | Status: AC

## 2016-03-13 MED ORDER — IPRATROPIUM BROMIDE 0.06 % NA SOLN: 2.0000 | Freq: Four times a day (QID) | NASAL | 2 refills | Status: AC

## 2016-03-13 NOTE — ED Provider Notes (Addendum)
[facility]    CSN: [id number] Arrival date & time: 03/13/16  1530     History   Chief Complaint Chief Complaint  Patient presents with  . URI    HPI Victoria Brennan is a 36 y.o. female.   The history is provided by the patient.  URI  Presenting symptoms: congestion, cough, rhinorrhea and sore throat   Presenting symptoms: no fever   Severity:  Moderate Onset quality:  Gradual Duration:  3 weeks Progression:  Unchanged Chronicity:  New Relieved by:  None tried Worsened by:  Nothing Ineffective treatments:  None tried Associated symptoms: myalgias     Past Medical History:  Diagnosis Date  . Anxiety   . Depression   . High cholesterol   . Migraine   . Persistent headaches     Patient Active Problem List   Diagnosis Date Noted  . Persistent headaches   . ANEMIA, MILD 03/01/2008  . DEPRESSION 03/01/2008  . PYELONEPHRITIS 03/01/2008    No past surgical history on file.  OB History    No data available       Home Medications    Prior to Admission medications   Medication Sig Start Date End Date Taking? Authorizing Provider  Aspirin-Salicylamide-Caffeine (BC HEADACHE POWDER PO) Take 1 packet by mouth every 6 (six) hours as needed (For migraines.).    Yes Historical Provider, MD  ibuprofen (ADVIL,MOTRIN) 200 MG tablet Take 800 mg by mouth every 6 (six) hours as needed (For migraines or menstrual cramps.).    Yes Historical Provider, MD  omeprazole (PRILOSEC) 20 MG capsule Take 1 capsule (20 mg total) by mouth daily. 01/23/14  Yes Junius Finner, PA-C  traMADol (ULTRAM) 50 MG tablet Take 1 tablet (50 mg total) by mouth every 6 (six) hours as needed. 04/15/14  Yes Charm Rings, MD  ciprofloxacin (CIPRO) 500 MG tablet Take 1 tablet (500 mg total) by mouth 2 (two) times daily. Patient not taking: Reported on 06/13/2014 04/15/14   Charm Rings, MD  doxycycline (VIBRAMYCIN) 100 MG capsule Take 1 capsule (100 mg total) by mouth 2 (two) times daily. 03/13/16    Linna Hoff, MD  ipratropium (ATROVENT) 0.06 % nasal spray Place 2 sprays into both nostrils 4 (four) times daily. 03/13/16   Linna Hoff, MD  ondansetron (ZOFRAN ODT) 8 MG disintegrating tablet Take 1 tablet (8 mg total) by mouth every 8 (eight) hours as needed for nausea or vomiting. Patient not taking: Reported on 06/13/2014 03/25/13   Paula Libra, MD  polyethylene glycol (MIRALAX / GLYCOLAX) packet Take 17 g by mouth daily as needed (For constipation.).     Historical Provider, MD    Family History Family History  Problem Relation Age of Onset  . Hypertension Mother   . Heart disease Mother   . Hypertension Maternal Grandmother   . Diabetes Maternal Grandmother   . Cancer Paternal Grandmother   . Heart disease Paternal Grandfather   . Hypertension Father   . Depression Brother   . Insomnia Mother   . Insomnia Father     Social History Social History  Substance Use Topics  . Smoking status: Never Smoker  . Smokeless tobacco: Never Used  . Alcohol use 1.1 oz/week    1 Glasses of wine, 1 Standard drinks or equivalent per week     Allergies   Milk-related compounds   Review of Systems Review of Systems  Constitutional: Negative for fever.  HENT: Positive for congestion, postnasal drip,  rhinorrhea and sore throat.   Respiratory: Positive for cough.   Cardiovascular: Negative.   Gastrointestinal: Negative.   Musculoskeletal: Positive for myalgias.  All other systems reviewed and are negative.    Physical Exam Triage Vital Signs ED Triage Vitals [03/13/16 1556]  Enc Vitals Group     BP 117/65     Pulse Rate 102     Resp 16     Temp 99.9 F (37.7 C)     Temp Source Oral     SpO2 100 %     Weight      Height      Head Circumference      Peak Flow      Pain Score      Pain Loc      Pain Edu?      Excl. in GC?    No data found.   Updated Vital Signs BP 117/65 (BP Location: Right Arm)   Pulse 102   Temp 99.9 F (37.7 C) (Oral)   Resp 16   LMP  02/28/2016   SpO2 100%   Visual Acuity Right Eye Distance:   Left Eye Distance:   Bilateral Distance:    Right Eye Near:   Left Eye Near:    Bilateral Near:     Physical Exam  Constitutional: She is oriented to person, place, and time. She appears well-developed and well-nourished.  HENT:  Right Ear: External ear normal.  Left Ear: External ear normal.  Nose: Mucosal edema, rhinorrhea and sinus tenderness present. Right sinus exhibits maxillary sinus tenderness. Left sinus exhibits maxillary sinus tenderness.  Mouth/Throat: Oropharynx is clear and moist.  Neck: Normal range of motion. Neck supple.  Cardiovascular: Normal rate, regular rhythm, normal heart sounds and intact distal pulses.   Pulmonary/Chest: Effort normal and breath sounds normal.  Lymphadenopathy:    She has no cervical adenopathy.  Neurological: She is alert and oriented to person, place, and time.  Skin: Skin is warm and dry.  Nursing note and vitals reviewed.    UC Treatments / Results  Labs (all labs ordered are listed, but only abnormal results are displayed) Labs Reviewed - No data to display  EKG  EKG Interpretation None       Radiology Dg Chest 2 View  Result Date: 03/13/2016 CLINICAL DATA:  One week of productive cough and fever and shortness of breath. Nonsmoker. No cardiopulmonary history. EXAM: CHEST  2 VIEW COMPARISON:  PA and lateral chest x-ray of Jun 13, 2014 FINDINGS: The lungs are well-expanded and clear. The heart and pulmonary vascularity are normal. The mediastinum is normal in width. There is no pleural effusion. The trachea is midline. The bony thorax is unremarkable. IMPRESSION: There is no pneumonia nor other acute cardiopulmonary abnormality. Electronically Signed   By: David  [place] M.D.   On: 03/13/2016 16:29    Procedures Procedures (including critical care time)  Medications Ordered in UC Medications - No data to display   Initial Impression / Assessment and Plan /  UC Course  I have reviewed the triage vital signs and the nursing notes.  Pertinent labs & imaging results that were available during my care of the patient were reviewed by me and considered in my medical decision making (see chart for details).       Final Clinical Impressions(s) / UC Diagnoses   Final diagnoses:  Subacute maxillary sinusitis    New Prescriptions Discharge Medication List as of 03/13/2016  5:06 PM    START taking  these medications   Details  ipratropium (ATROVENT) 0.06 % nasal spray Place 2 sprays into both nostrils 4 (four) times daily., Starting Wed 03/13/2016, Print         Linna HoffJames D Ousmane Seeman, MD 03/13/16 1706    Linna HoffJames D Nathalie Cavendish, MD 03/13/16 [id number]

## 2016-03-13 NOTE — ED Triage Notes (Signed)
C/o cold sx States she has nasal congestion, jaw pain, cough, sob and chest is tight otc meds used  Cough is productive with green mucous
